# Patient Record
Sex: Male | Born: 1965 | Race: White | Hispanic: No | Marital: Married | Smoking: Former smoker
Health system: Southern US, Community
[De-identification: ages and names within clinical notes are randomized; demographics above are authoritative.]

## PROBLEM LIST (undated history)

## (undated) DIAGNOSIS — F419 Anxiety disorder, unspecified: Secondary | ICD-10-CM

## (undated) DIAGNOSIS — E7800 Pure hypercholesterolemia, unspecified: Secondary | ICD-10-CM

## (undated) DIAGNOSIS — I10 Essential (primary) hypertension: Secondary | ICD-10-CM

---

## 1988-07-14 HISTORY — PX: KNEE SURGERY: SHX244

## 2014-04-03 ENCOUNTER — Telehealth: Payer: Self-pay

## 2014-04-03 NOTE — Telephone Encounter (Signed)
Pt called to speak with DS regarding his letter to set up his colonoscopy. Patient said that he was extremely hard to reach by phone and he would try calling back today on his lunch break. I told him that I think DS goes to lunch from 1230-130 and he said that they would be on break during the same time. I asked if there was another time during the afternoon she could reach him and he said no that he would have to call DS whenever the opportunity presented itself. I told him that I would let DS be aware that he would be calling back and we will have to take it from there if she's available. Pt agreed.

## 2014-04-04 ENCOUNTER — Telehealth: Payer: Self-pay

## 2014-04-06 NOTE — Telephone Encounter (Signed)
Gastroenterology Pre-Procedure Review  Request Date:04/04/2014 Requesting Physician: Dr. Gerarda Fraction  PATIENT REVIEW QUESTIONS: The patient responded to the following health history questions as indicated:   This will be pt's first colonoscopy/ he said he drinks 2-3 beers daily   1. Diabetes Melitis: no 2. Joint replacements in the past 12 months: no 3. Major health problems in the past 3 months: no 4. Has an artificial valve or MVP: no 5. Has a defibrillator: no 6. Has been advised in past to take antibiotics in advance of a procedure like teeth cleaning: no    MEDICATIONS & ALLERGIES:    Patient reports the following regarding taking any blood thinners:   Plavix? no Aspirin? no Coumadin? no  Patient confirms/reports the following medications:  Current Outpatient Prescriptions  Medication Sig Dispense Refill  . clonazePAM (KLONOPIN) 2 MG tablet Take 2 mg by mouth daily.      . fenofibrate (TRICOR) 145 MG tablet Take 145 mg by mouth daily.      Marland Kitchen losartan (COZAAR) 50 MG tablet Take 50 mg by mouth daily.      . nabumetone (RELAFEN) 500 MG tablet Take 500 mg by mouth daily.      . simvastatin (ZOCOR) 40 MG tablet Take 40 mg by mouth daily.       No current facility-administered medications for this visit.    Patient confirms/reports the following allergies:  Allergies not on file  No orders of the defined types were placed in this encounter.    AUTHORIZATION INFORMATION Primary Insurance:   ID #:   Group #:  Pre-Cert / Auth required:  Pre-Cert / Auth #:   Secondary Insurance:   ID #:   Group #:  Pre-Cert / Auth required: Pre-Cert / Auth #:   SCHEDULE INFORMATION: Procedure has been scheduled as follows:  Date: 04/27/2014                 Time:  9:30 AM Location: [facility] Short Stay  This Gastroenterology Pre-Precedure Review Form is being routed to the following provider(s): Barney Drain, MD

## 2014-04-10 NOTE — Telephone Encounter (Signed)
MOVI PREP SPLIT DOSING, REGULAR BREAKFAST. CLEAR LIQUIDS AFTER 9 AM.  Pt will need phenergan AFTER HE SIGNS  HIS CONSENT.

## 2014-04-11 ENCOUNTER — Other Ambulatory Visit: Payer: Self-pay

## 2014-04-11 DIAGNOSIS — Z1211 Encounter for screening for malignant neoplasm of colon: Secondary | ICD-10-CM

## 2014-04-11 MED ORDER — PEG-KCL-NACL-NASULF-NA ASC-C 100 G PO SOLR: 1.0000 | ORAL | Status: DC

## 2014-04-11 NOTE — Telephone Encounter (Signed)
How much phenergan?

## 2014-04-11 NOTE — Telephone Encounter (Signed)
Rx sent to the pharmacy and instructions mailed to pt.  

## 2014-04-11 NOTE — Addendum Note (Signed)
Addended by: Everardo All on: 04/11/2014 10:43 AM   Modules accepted: Orders

## 2014-04-12 NOTE — Telephone Encounter (Signed)
Will give as part of sedation. Nurses just need to pull phenergan for the room.

## 2014-04-13 ENCOUNTER — Other Ambulatory Visit: Payer: Self-pay

## 2014-04-14 NOTE — Telephone Encounter (Signed)
Scott Buck is aware in Endo and has added the note.

## 2014-04-27 ENCOUNTER — Ambulatory Visit (HOSPITAL_COMMUNITY)
Admission: RE | Admit: 2014-04-27 | Discharge: 2014-04-27 | Disposition: A | Discharge status: Home / Self Care | Payer: BC Managed Care – PPO | Source: Ambulatory Visit | Attending: Gastroenterology | Admitting: Gastroenterology

## 2014-04-27 ENCOUNTER — Encounter (HOSPITAL_COMMUNITY): Payer: Self-pay | Admitting: *Deleted

## 2014-04-27 ENCOUNTER — Encounter (HOSPITAL_COMMUNITY)
Admission: RE | Disposition: A | Discharge status: Home / Self Care | Payer: Self-pay | Source: Ambulatory Visit | Attending: Gastroenterology

## 2014-04-27 DIAGNOSIS — D123 Benign neoplasm of transverse colon: Secondary | ICD-10-CM | POA: Diagnosis not present

## 2014-04-27 DIAGNOSIS — I10 Essential (primary) hypertension: Secondary | ICD-10-CM | POA: Diagnosis not present

## 2014-04-27 DIAGNOSIS — K648 Other hemorrhoids: Secondary | ICD-10-CM | POA: Insufficient documentation

## 2014-04-27 DIAGNOSIS — D128 Benign neoplasm of rectum: Secondary | ICD-10-CM

## 2014-04-27 DIAGNOSIS — F419 Anxiety disorder, unspecified: Secondary | ICD-10-CM | POA: Insufficient documentation

## 2014-04-27 DIAGNOSIS — Z1211 Encounter for screening for malignant neoplasm of colon: Secondary | ICD-10-CM | POA: Diagnosis present

## 2014-04-27 DIAGNOSIS — E780 Pure hypercholesterolemia: Secondary | ICD-10-CM | POA: Insufficient documentation

## 2014-04-27 DIAGNOSIS — Z79899 Other long term (current) drug therapy: Secondary | ICD-10-CM | POA: Diagnosis not present

## 2014-04-27 DIAGNOSIS — F17210 Nicotine dependence, cigarettes, uncomplicated: Secondary | ICD-10-CM | POA: Diagnosis not present

## 2014-04-27 HISTORY — DX: Pure hypercholesterolemia, unspecified: E78.00

## 2014-04-27 HISTORY — DX: Essential (primary) hypertension: I10

## 2014-04-27 HISTORY — PX: COLONOSCOPY: SHX5424

## 2014-04-27 HISTORY — DX: Anxiety disorder, unspecified: F41.9

## 2014-04-27 SURGERY — COLONOSCOPY
Anesthesia: Moderate Sedation

## 2014-04-27 MED ORDER — PROMETHAZINE HCL 25 MG/ML IJ SOLN
INTRAMUSCULAR | Status: AC
  Filled 2014-04-27: qty 1

## 2014-04-27 MED ORDER — SODIUM CHLORIDE 0.9 % IV SOLN
INTRAVENOUS | Status: DC
  Administered 2014-04-27: 1000 mL via INTRAVENOUS

## 2014-04-27 MED ORDER — STERILE WATER FOR IRRIGATION IR SOLN
Status: DC | PRN
  Administered 2014-04-27: 10:00:00

## 2014-04-27 MED ORDER — PROMETHAZINE HCL 25 MG/ML IJ SOLN
INTRAMUSCULAR | Status: DC | PRN
  Administered 2014-04-27: 12.5 mg via INTRAVENOUS

## 2014-04-27 MED ORDER — MIDAZOLAM HCL 5 MG/5ML IJ SOLN
INTRAMUSCULAR | Status: DC
Start: 2014-04-27 — End: 2014-04-27
  Filled 2014-04-27: qty 10

## 2014-04-27 MED ORDER — MEPERIDINE HCL 100 MG/ML IJ SOLN
INTRAMUSCULAR | Status: DC | PRN
  Administered 2014-04-27: 25 mg via INTRAVENOUS
  Administered 2014-04-27: 50 mg via INTRAVENOUS
  Administered 2014-04-27: 25 mg via INTRAVENOUS

## 2014-04-27 MED ORDER — MIDAZOLAM HCL 5 MG/5ML IJ SOLN
INTRAMUSCULAR | Status: DC | PRN
  Administered 2014-04-27: 2 mg via INTRAVENOUS
  Administered 2014-04-27: 1 mg via INTRAVENOUS
  Administered 2014-04-27: 2 mg via INTRAVENOUS
  Administered 2014-04-27: 1 mg via INTRAVENOUS

## 2014-04-27 MED ORDER — MEPERIDINE HCL 100 MG/ML IJ SOLN
INTRAMUSCULAR | Status: AC
  Filled 2014-04-27: qty 2

## 2014-04-27 NOTE — Discharge Instructions (Signed)
You had 2 polyps removed. You have internal hemorrhoids.    FOLLOW A HIGH FIBER DIET. AVOID ITEMS THAT CAUSE BLOATING & GAS. SEE INFO BELOW.  YOUR BIOPSY RESULTS SHOULD BE BACK IN 14 DAYS.  Next colonoscopy in 5-10 years. Colonoscopy Care After Read the instructions outlined below and refer to this sheet in the next week. These discharge instructions provide you with general information on caring for yourself after you leave the hospital. While your treatment has been planned according to the most current medical practices available, unavoidable complications occasionally occur. If you have any problems or questions after discharge, call DR. Pasqual Farias, [phone].  ACTIVITY  You may resume your regular activity, but move at a slower pace for the next 24 hours.   Take frequent rest periods for the next 24 hours.   Walking will help get rid of the air and reduce the bloated feeling in your belly (abdomen).   No driving for 24 hours (because of the medicine (anesthesia) used during the test).   You may shower.   Do not sign any important legal documents or operate any machinery for 24 hours (because of the anesthesia used during the test).    NUTRITION  Drink plenty of fluids.   You may resume your normal diet as instructed by your doctor.   Begin with a light meal and progress to your normal diet. Heavy or fried foods are harder to digest and may make you feel sick to your stomach (nauseated).   Avoid alcoholic beverages for 24 hours or as instructed.    MEDICATIONS  You may resume your normal medications.   WHAT YOU CAN EXPECT TODAY  Some feelings of bloating in the abdomen.   Passage of more gas than usual.   Spotting of blood in your stool or on the toilet paper  .  IF YOU HAD POLYPS REMOVED DURING THE COLONOSCOPY:  Eat a soft diet IF YOU HAVE NAUSEA, BLOATING, ABDOMINAL PAIN, OR VOMITING.    FINDING OUT THE RESULTS OF YOUR TEST Not all test results are  available during your visit. DR. Oneida Alar WILL CALL YOU WITHIN 7 DAYS OF YOUR PROCEDUE WITH YOUR RESULTS. Do not assume everything is normal if you have not heard from DR. Silviano Neuser IN ONE WEEK, CALL HER OFFICE AT [phone].  SEEK IMMEDIATE MEDICAL ATTENTION AND CALL THE OFFICE: [phone] IF:  You have more than a spotting of blood in your stool.   Your belly is swollen (abdominal distention).   You are nauseated or vomiting.   You have a temperature over 101F.   You have abdominal pain or discomfort that is severe or gets worse throughout the day.   High-Fiber Diet A high-fiber diet changes your normal diet to include more whole grains, legumes, fruits, and vegetables. Changes in the diet involve replacing refined carbohydrates with unrefined foods. The calorie level of the diet is essentially unchanged. The Dietary Reference Intake (recommended amount) for adult males is 38 grams per day. For adult females, it is 25 grams per day. Pregnant and lactating women should consume 28 grams of fiber per day. Fiber is the intact part of a plant that is not broken down during digestion. Functional fiber is fiber that has been isolated from the plant to provide a beneficial effect in the body. PURPOSE  Increase stool bulk.   Ease and regulate bowel movements.   Lower cholesterol.  INDICATIONS THAT YOU NEED MORE FIBER  Constipation and hemorrhoids.   Uncomplicated diverticulosis (intestine condition)  and irritable bowel syndrome.   Weight management.   As a protective measure against hardening of the arteries (atherosclerosis), diabetes, and cancer.   GUIDELINES FOR INCREASING FIBER IN THE DIET  Start adding fiber to the diet slowly. A gradual increase of about 5 more grams (2 slices of whole-wheat bread, 2 servings of most fruits or vegetables, or 1 bowl of high-fiber cereal) per day is best. Too rapid an increase in fiber may result in constipation, flatulence, and bloating.   Drink  enough water and fluids to keep your urine clear or pale yellow. Water, juice, or caffeine-free drinks are recommended. Not drinking enough fluid may cause constipation.   Eat a variety of high-fiber foods rather than one type of fiber.   Try to increase your intake of fiber through using high-fiber foods rather than fiber pills or supplements that contain small amounts of fiber.   The goal is to change the types of food eaten. Do not supplement your present diet with high-fiber foods, but replace foods in your present diet.  INCLUDE A VARIETY OF FIBER SOURCES  Replace refined and processed grains with whole grains, canned fruits with fresh fruits, and incorporate other fiber sources. White rice, white breads, and most bakery goods contain little or no fiber.   Brown whole-grain rice, buckwheat oats, and many fruits and vegetables are all good sources of fiber. These include: broccoli, Brussels sprouts, cabbage, cauliflower, beets, sweet potatoes, white potatoes (skin on), carrots, tomatoes, eggplant, squash, berries, fresh fruits, and dried fruits.   Cereals appear to be the richest source of fiber. Cereal fiber is found in whole grains and bran. Bran is the fiber-rich outer coat of cereal grain, which is largely removed in refining. In whole-grain cereals, the bran remains. In breakfast cereals, the largest amount of fiber is found in those with "bran" in their names. The fiber content is sometimes indicated on the label.   You may need to include additional fruits and vegetables each day.   In baking, for 1 cup white flour, you may use the following substitutions:   1 cup whole-wheat flour minus 2 tablespoons.   1/2 cup white flour plus 1/2 cup whole-wheat flour.   Polyps, Colon  A polyp is extra tissue that grows inside your body. Colon polyps grow in the large intestine. The large intestine, also called the colon, is part of your digestive system. It is a long, hollow tube at the end of  your digestive tract where your body makes and stores stool. Most polyps are not dangerous. They are benign. This means they are not cancerous. But over time, some types of polyps can turn into cancer. Polyps that are smaller than a pea are usually not harmful. But larger polyps could someday become or may already be cancerous. To be safe, doctors remove all polyps and test them.   WHO GETS POLYPS? Anyone can get polyps, but certain people are more likely than others. You may have a greater chance of getting polyps if:  You are over 50.   You have had polyps before.   Someone in your family has had polyps.   Someone in your family has had cancer of the large intestine.   Find out if someone in your family has had polyps. You may also be more likely to get polyps if you:   Eat a lot of fatty foods   Smoke   Drink alcohol   Do not exercise  Eat too much   TREATMENT  The caregiver will remove the polyp during sigmoidoscopy or colonoscopy.    PREVENTION There is not one sure way to prevent polyps. You might be able to lower your risk of getting them if you:  Eat more fruits and vegetables and less fatty food.   Do not smoke.   Avoid alcohol.   Exercise every day.   Lose weight if you are overweight.   Eating more calcium and folate can also lower your risk of getting polyps. Some foods that are rich in calcium are milk, cheese, and broccoli. Some foods that are rich in folate are chickpeas, kidney beans, and spinach.   Hemorrhoids Hemorrhoids are dilated (enlarged) veins around the rectum. Sometimes clots will form in the veins. This makes them swollen and painful. These are called thrombosed hemorrhoids. Causes of hemorrhoids include:  Constipation.   Straining to have a bowel movement.   HEAVY LIFTING HOME CARE INSTRUCTIONS  Eat a well balanced diet and drink 6 to 8 glasses of water every day to avoid constipation. You may also use a bulk laxative.   Avoid  straining to have bowel movements.   Keep anal area dry and clean.   Do not use a donut shaped pillow or sit on the toilet for long periods. This increases blood pooling and pain.   Move your bowels when your body has the urge; this will require less straining and will decrease pain and pressure.

## 2014-04-27 NOTE — H&P (Signed)
  Primary Care Physician:  Glo Herring., MD Primary Gastroenterologist:  Dr. Oneida Alar  Pre-Procedure History & Physical: HPI:  Scott Buck is a 48 y.o. male here for COLON CANCER SCREENING.  Past Medical History  Diagnosis Date  . Hypertension   . Hypercholesteremia   . Anxiety     Past Surgical History  Procedure Laterality Date  . Knee surgery Right 1990    To repair right patella    Prior to Admission medications   Medication Sig Start Date End Date Taking? Authorizing Provider  acetaminophen (TYLENOL) 500 MG tablet Take 1,000 mg by mouth daily.   Yes Historical Provider, MD  clonazePAM (KLONOPIN) 2 MG tablet Take 2 mg by mouth daily.   Yes Historical Provider, MD  fenofibrate (TRICOR) 145 MG tablet Take 145 mg by mouth daily.   Yes Historical Provider, MD  losartan (COZAAR) 50 MG tablet Take 50 mg by mouth daily.   Yes Historical Provider, MD  nabumetone (RELAFEN) 500 MG tablet Take 500 mg by mouth daily.   Yes Historical Provider, MD  peg 3350 powder (MOVIPREP) 100 G SOLR Take 1 kit (200 g total) by mouth as directed. 04/11/14  Yes Danie Binder, MD  simvastatin (ZOCOR) 40 MG tablet Take 40 mg by mouth at bedtime.    Yes Historical Provider, MD    Allergies as of 04/13/2014  . (Not on File)    Family History  Problem Relation Age of Onset  . Cancer - Other Mother   . Cancer - Lung Father   . Emphysema Father   . Pulmonary fibrosis Father   . Hypertension Sister   . Hypercholesterolemia Sister   . Hypertension Brother   . Hypercholesterolemia Brother   . Hypertension Brother   . Hypercholesterolemia Brother     History   Social History  . Marital Status: Married    Spouse Name: N/A    Number of Children: N/A  . Years of Education: N/A   Occupational History  . Not on file.   Social History Main Topics  . Smoking status: Current Every Day Smoker -- 0.75 packs/day for 35 years    Types: Cigarettes  . Smokeless tobacco: Not on file  . Alcohol Use:  Yes     Comment: 12 pack Beer per week  . Drug Use: No  . Sexual Activity: Not on file   Other Topics Concern  . Not on file   Social History Narrative  . No narrative on file    Review of Systems: See HPI, otherwise negative ROS   Physical Exam: BP 134/84  Pulse 83  Temp(Src) 97.5 F (36.4 C) (Oral)  Resp 17  Ht $R'5\' 7"'Be$  (1.702 m)  Wt 172 lb (78.019 kg)  BMI 26.93 kg/m2  SpO2 99% General:   Alert,  pleasant and cooperative in NAD Head:  Normocephalic and atraumatic. Neck:  Supple; Lungs:  Clear throughout to auscultation.    Heart:  Regular rate and rhythm. Abdomen:  Soft, nontender and nondistended. Normal bowel sounds, without guarding, and without rebound.   Neurologic:  Alert and  oriented x4;  grossly normal neurologically.  Impression/Plan:     SCREENING  Plan:  1. TCS TODAY

## 2014-04-30 NOTE — Op Note (Signed)
[facility] [address] [place], [address]   COLONOSCOPY PROCEDURE REPORT  PATIENT: Scott Buck, Scott Buck  MR#: [id number] BIRTHDATE: 1965-12-30 , 48  yrs. old GENDER: male ENDOSCOPIST: Barney Drain, MD REFERRED CZ:YSAYT Gerarda Fraction, M.D. PROCEDURE DATE:  04/27/2014 PROCEDURE:   Colonoscopy with snare polypectomy and Colonoscopy with cold biopsy polypectomy INDICATIONS:average risk for colon cancer. MEDICATIONS: Promethazine (Phenergan) 12.5 mg IV, Demerol 100 mg IV, and Versed 6 mg IV  DESCRIPTION OF PROCEDURE:    Physical exam was performed.  Informed consent was obtained from the patient after explaining the benefits, risks, and alternatives to procedure.  The patient was connected to monitor and placed in left lateral position. Continuous oxygen was provided by nasal cannula and IV medicine administered through an indwelling cannula.  After administration of sedation and rectal exam, the patients rectum was intubated and the EC-3890Li ([id number])  colonoscope was advanced under direct visualization to the ileum.  The scope was removed slowly by carefully examining the color, texture, anatomy, and integrity mucosa on the way out.  The patient was recovered in endoscopy and discharged home in satisfactory condition.     COLON FINDINGS: A sessile polyp measuring 6 mm in size was found at the hepatic flexure.  A polypectomy was performed using snare cautery.  , A sessile polyp measuring 4 mm in size was found in the rectum.  , The examination was otherwise normal.  , and Moderate sized internal hemorrhoids were found.  PREP QUALITY: excellent.  CECAL W/D TIME: 12 MINS COMPLICATIONS: None  ENDOSCOPIC IMPRESSION: 1.   TWO COLON POLYPS REMOVED 2.   Moderate sized internal hemorrhoids  RECOMMENDATIONS: FOLLOW A HIGH FIBER DIET.  AVOID ITEMS THAT CAUSE BLOATING & GAS. BIOPSY RESULTS SHOULD BE BACK IN 14 DAYS. Next colonoscopy in 5-10  years.      _______________________________ Lorrin MaisBarney Drain, MD May 14, 2014 10:59 AM   CPT CODES: ICD CODES:  The ICD and CPT codes recommended by this software are interpretations from the data that the clinical staff has captured with the software.  The verification of the translation of this report to the ICD and CPT codes and modifiers is the sole responsibility of the health care institution and practicing physician where this report was generated.  [employer] will not be held responsible for the validity of the ICD and CPT codes included on this report.  AMA assumes no liability for data contained or not contained herein. CPT is a Designer, television/film set of the [employer].

## 2014-05-01 ENCOUNTER — Encounter (HOSPITAL_COMMUNITY): Payer: Self-pay | Admitting: Gastroenterology

## 2014-05-04 ENCOUNTER — Telehealth: Payer: Self-pay | Admitting: Gastroenterology

## 2014-05-04 NOTE — Telephone Encounter (Signed)
PATIENT CALLED INQUIRING ABOUT RESULTS FROM COLONOSCOPY

## 2014-05-09 NOTE — Telephone Encounter (Signed)
Reminder in epic °

## 2014-05-09 NOTE — Telephone Encounter (Signed)
Please call pt. HE had ONE simple ADENOMA AND ONE HYPERPLASTIC POLYP removed. FOLLOW A HIGH FIBER DIET. TCS IN 5 YEARS.

## 2014-05-09 NOTE — Telephone Encounter (Signed)
Pt called again and has not heard from the path on colonoscopy. He said he knew Dr. Oneida Alar removed two polyps. I explained to him that she is on vacation and he is very concerned, because he had a couple of calls from [facility], but no one left a message. I told him I will get Laban Emperor, NP to look at the results so I can give him a call back later today and put his mind at ease. He can be reached at work this afternoon at [phone].

## 2014-05-09 NOTE — Telephone Encounter (Signed)
LMOM to call back

## 2014-05-10 NOTE — Telephone Encounter (Signed)
Pt is aware of results. 

## 2015-09-25 ENCOUNTER — Other Ambulatory Visit (HOSPITAL_COMMUNITY): Payer: Self-pay | Admitting: Family Medicine

## 2015-09-25 ENCOUNTER — Ambulatory Visit (HOSPITAL_COMMUNITY)
Admission: RE | Admit: 2015-09-25 | Discharge: 2015-09-25 | Disposition: A | Discharge status: Home / Self Care | Payer: BLUE CROSS/BLUE SHIELD | Source: Ambulatory Visit | Attending: Family Medicine | Admitting: Family Medicine

## 2015-09-25 DIAGNOSIS — J069 Acute upper respiratory infection, unspecified: Secondary | ICD-10-CM | POA: Insufficient documentation

## 2015-09-25 DIAGNOSIS — Z8701 Personal history of pneumonia (recurrent): Secondary | ICD-10-CM | POA: Insufficient documentation

## 2015-09-25 DIAGNOSIS — J181 Lobar pneumonia, unspecified organism: Secondary | ICD-10-CM | POA: Diagnosis present

## 2015-09-25 DIAGNOSIS — J111 Influenza due to unidentified influenza virus with other respiratory manifestations: Secondary | ICD-10-CM | POA: Diagnosis present

## 2015-11-15 DIAGNOSIS — D225 Melanocytic nevi of trunk: Secondary | ICD-10-CM | POA: Diagnosis not present

## 2015-11-15 DIAGNOSIS — F419 Anxiety disorder, unspecified: Secondary | ICD-10-CM | POA: Diagnosis not present

## 2015-11-15 DIAGNOSIS — Z1389 Encounter for screening for other disorder: Secondary | ICD-10-CM | POA: Diagnosis not present

## 2015-11-15 DIAGNOSIS — Z6825 Body mass index (BMI) 25.0-25.9, adult: Secondary | ICD-10-CM | POA: Diagnosis not present

## 2015-11-15 DIAGNOSIS — Z1283 Encounter for screening for malignant neoplasm of skin: Secondary | ICD-10-CM | POA: Diagnosis not present

## 2016-02-15 DIAGNOSIS — Z1389 Encounter for screening for other disorder: Secondary | ICD-10-CM | POA: Diagnosis not present

## 2016-02-15 DIAGNOSIS — I10 Essential (primary) hypertension: Secondary | ICD-10-CM | POA: Diagnosis not present

## 2016-02-15 DIAGNOSIS — Z6825 Body mass index (BMI) 25.0-25.9, adult: Secondary | ICD-10-CM | POA: Diagnosis not present

## 2016-02-15 DIAGNOSIS — Z0001 Encounter for general adult medical examination with abnormal findings: Secondary | ICD-10-CM | POA: Diagnosis not present

## 2016-02-15 DIAGNOSIS — E782 Mixed hyperlipidemia: Secondary | ICD-10-CM | POA: Diagnosis not present

## 2016-02-15 DIAGNOSIS — N529 Male erectile dysfunction, unspecified: Secondary | ICD-10-CM | POA: Diagnosis not present

## 2016-02-15 DIAGNOSIS — E663 Overweight: Secondary | ICD-10-CM | POA: Diagnosis not present

## 2016-02-15 DIAGNOSIS — M1731 Unilateral post-traumatic osteoarthritis, right knee: Secondary | ICD-10-CM | POA: Diagnosis not present

## 2016-02-15 DIAGNOSIS — M25561 Pain in right knee: Secondary | ICD-10-CM | POA: Diagnosis not present

## 2016-03-14 DIAGNOSIS — M25561 Pain in right knee: Secondary | ICD-10-CM | POA: Diagnosis not present

## 2016-03-20 DIAGNOSIS — M25561 Pain in right knee: Secondary | ICD-10-CM | POA: Diagnosis not present

## 2016-03-20 DIAGNOSIS — M1731 Unilateral post-traumatic osteoarthritis, right knee: Secondary | ICD-10-CM | POA: Diagnosis not present

## 2016-08-11 DIAGNOSIS — J029 Acute pharyngitis, unspecified: Secondary | ICD-10-CM | POA: Diagnosis not present

## 2016-08-11 DIAGNOSIS — Z1389 Encounter for screening for other disorder: Secondary | ICD-10-CM | POA: Diagnosis not present

## 2016-08-11 DIAGNOSIS — I10 Essential (primary) hypertension: Secondary | ICD-10-CM | POA: Diagnosis not present

## 2016-08-11 DIAGNOSIS — R05 Cough: Secondary | ICD-10-CM | POA: Diagnosis not present

## 2016-08-11 DIAGNOSIS — Z6825 Body mass index (BMI) 25.0-25.9, adult: Secondary | ICD-10-CM | POA: Diagnosis not present

## 2016-08-11 DIAGNOSIS — Z79899 Other long term (current) drug therapy: Secondary | ICD-10-CM | POA: Diagnosis not present

## 2016-10-03 DIAGNOSIS — Z6825 Body mass index (BMI) 25.0-25.9, adult: Secondary | ICD-10-CM | POA: Diagnosis not present

## 2016-10-03 DIAGNOSIS — Z1389 Encounter for screening for other disorder: Secondary | ICD-10-CM | POA: Diagnosis not present

## 2016-10-03 DIAGNOSIS — M62838 Other muscle spasm: Secondary | ICD-10-CM | POA: Diagnosis not present

## 2016-11-07 DIAGNOSIS — M542 Cervicalgia: Secondary | ICD-10-CM | POA: Diagnosis not present

## 2016-11-19 DIAGNOSIS — M542 Cervicalgia: Secondary | ICD-10-CM | POA: Diagnosis not present

## 2016-11-25 DIAGNOSIS — M4682 Other specified inflammatory spondylopathies, cervical region: Secondary | ICD-10-CM | POA: Diagnosis not present

## 2016-11-25 DIAGNOSIS — M50221 Other cervical disc displacement at C4-C5 level: Secondary | ICD-10-CM | POA: Diagnosis not present

## 2016-11-25 DIAGNOSIS — M4802 Spinal stenosis, cervical region: Secondary | ICD-10-CM | POA: Diagnosis not present

## 2016-11-25 DIAGNOSIS — M542 Cervicalgia: Secondary | ICD-10-CM | POA: Diagnosis not present

## 2016-12-16 DIAGNOSIS — M50221 Other cervical disc displacement at C4-C5 level: Secondary | ICD-10-CM | POA: Diagnosis not present

## 2016-12-16 DIAGNOSIS — M542 Cervicalgia: Secondary | ICD-10-CM | POA: Diagnosis not present

## 2016-12-23 DIAGNOSIS — Z1389 Encounter for screening for other disorder: Secondary | ICD-10-CM | POA: Diagnosis not present

## 2016-12-23 DIAGNOSIS — E663 Overweight: Secondary | ICD-10-CM | POA: Diagnosis not present

## 2016-12-23 DIAGNOSIS — Z6825 Body mass index (BMI) 25.0-25.9, adult: Secondary | ICD-10-CM | POA: Diagnosis not present

## 2016-12-23 DIAGNOSIS — I10 Essential (primary) hypertension: Secondary | ICD-10-CM | POA: Diagnosis not present

## 2016-12-23 DIAGNOSIS — E782 Mixed hyperlipidemia: Secondary | ICD-10-CM | POA: Diagnosis not present

## 2016-12-23 DIAGNOSIS — E669 Obesity, unspecified: Secondary | ICD-10-CM | POA: Diagnosis not present

## 2016-12-23 DIAGNOSIS — F419 Anxiety disorder, unspecified: Secondary | ICD-10-CM | POA: Diagnosis not present

## 2017-03-02 DIAGNOSIS — Z6825 Body mass index (BMI) 25.0-25.9, adult: Secondary | ICD-10-CM | POA: Diagnosis not present

## 2017-03-02 DIAGNOSIS — F419 Anxiety disorder, unspecified: Secondary | ICD-10-CM | POA: Diagnosis not present

## 2017-03-02 DIAGNOSIS — E782 Mixed hyperlipidemia: Secondary | ICD-10-CM | POA: Diagnosis not present

## 2017-03-02 DIAGNOSIS — E291 Testicular hypofunction: Secondary | ICD-10-CM | POA: Diagnosis not present

## 2017-03-02 DIAGNOSIS — E663 Overweight: Secondary | ICD-10-CM | POA: Diagnosis not present

## 2017-03-02 DIAGNOSIS — K7689 Other specified diseases of liver: Secondary | ICD-10-CM | POA: Diagnosis not present

## 2017-03-02 DIAGNOSIS — R945 Abnormal results of liver function studies: Secondary | ICD-10-CM | POA: Diagnosis not present

## 2017-03-02 DIAGNOSIS — Z1389 Encounter for screening for other disorder: Secondary | ICD-10-CM | POA: Diagnosis not present

## 2017-04-06 ENCOUNTER — Encounter: Payer: Self-pay | Admitting: Gastroenterology

## 2017-05-13 ENCOUNTER — Ambulatory Visit: Payer: BLUE CROSS/BLUE SHIELD | Admitting: Gastroenterology

## 2017-07-03 DIAGNOSIS — R945 Abnormal results of liver function studies: Secondary | ICD-10-CM | POA: Diagnosis not present

## 2017-10-08 DIAGNOSIS — F419 Anxiety disorder, unspecified: Secondary | ICD-10-CM | POA: Diagnosis not present

## 2017-10-08 DIAGNOSIS — H8113 Benign paroxysmal vertigo, bilateral: Secondary | ICD-10-CM | POA: Diagnosis not present

## 2017-10-08 DIAGNOSIS — J329 Chronic sinusitis, unspecified: Secondary | ICD-10-CM | POA: Diagnosis not present

## 2017-10-08 DIAGNOSIS — R42 Dizziness and giddiness: Secondary | ICD-10-CM | POA: Diagnosis not present

## 2017-10-08 DIAGNOSIS — Z6825 Body mass index (BMI) 25.0-25.9, adult: Secondary | ICD-10-CM | POA: Diagnosis not present

## 2017-10-19 DIAGNOSIS — H52223 Regular astigmatism, bilateral: Secondary | ICD-10-CM | POA: Diagnosis not present

## 2017-10-19 DIAGNOSIS — H5203 Hypermetropia, bilateral: Secondary | ICD-10-CM | POA: Diagnosis not present

## 2017-10-19 DIAGNOSIS — H524 Presbyopia: Secondary | ICD-10-CM | POA: Diagnosis not present

## 2017-10-19 DIAGNOSIS — M5030 Other cervical disc degeneration, unspecified cervical region: Secondary | ICD-10-CM | POA: Diagnosis not present

## 2017-11-02 DIAGNOSIS — F17200 Nicotine dependence, unspecified, uncomplicated: Secondary | ICD-10-CM | POA: Diagnosis not present

## 2017-11-02 DIAGNOSIS — R42 Dizziness and giddiness: Secondary | ICD-10-CM | POA: Diagnosis not present

## 2017-11-02 DIAGNOSIS — Z7289 Other problems related to lifestyle: Secondary | ICD-10-CM | POA: Diagnosis not present

## 2017-11-02 DIAGNOSIS — Z8739 Personal history of other diseases of the musculoskeletal system and connective tissue: Secondary | ICD-10-CM | POA: Diagnosis not present

## 2018-01-25 IMAGING — DX DG CHEST 2V
2 series · 2 of 2 positions shown · non-contrast
Comparison: None.

CLINICAL DATA: Five months of chest pain when taking a deep breath.

EXAM:
CHEST  2 VIEW

[chest pa]
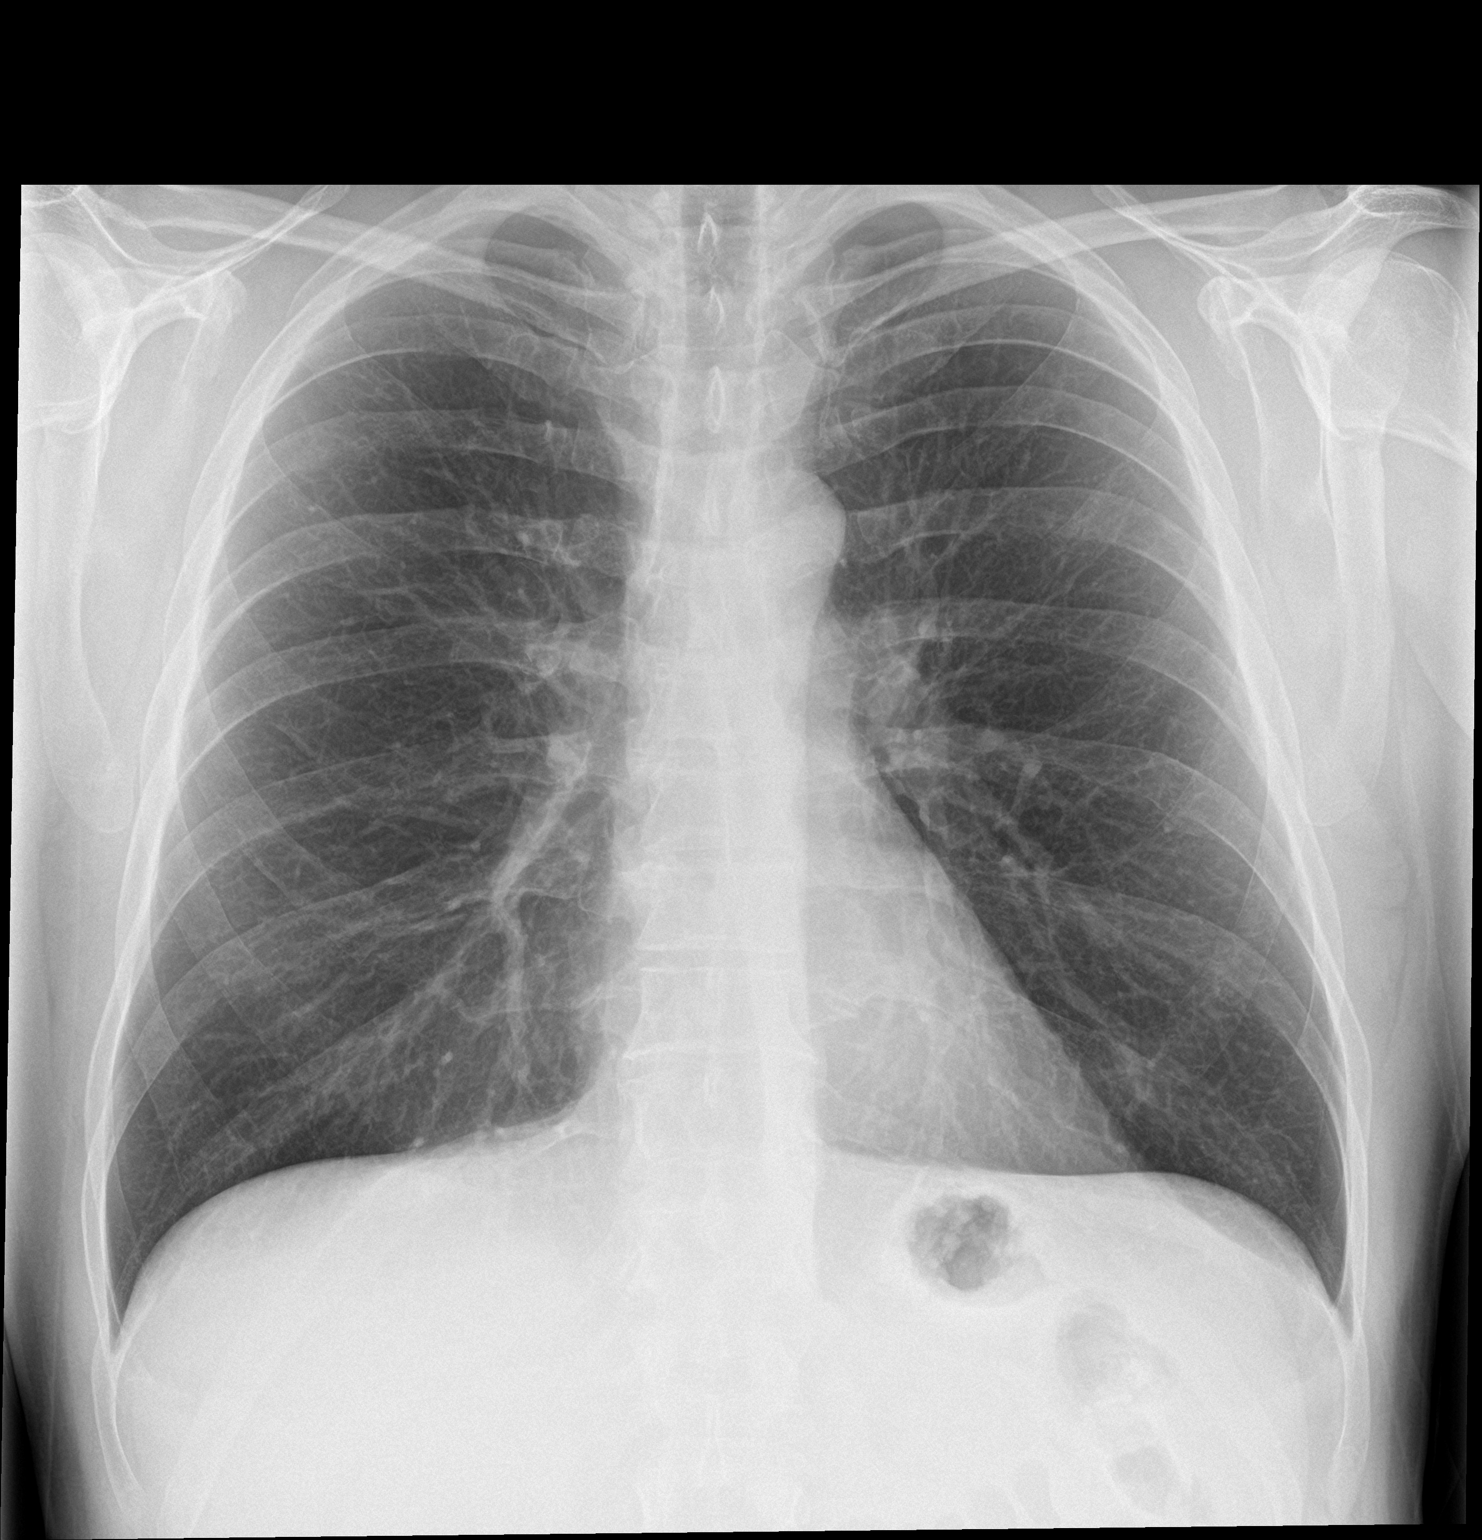

[chest lat]
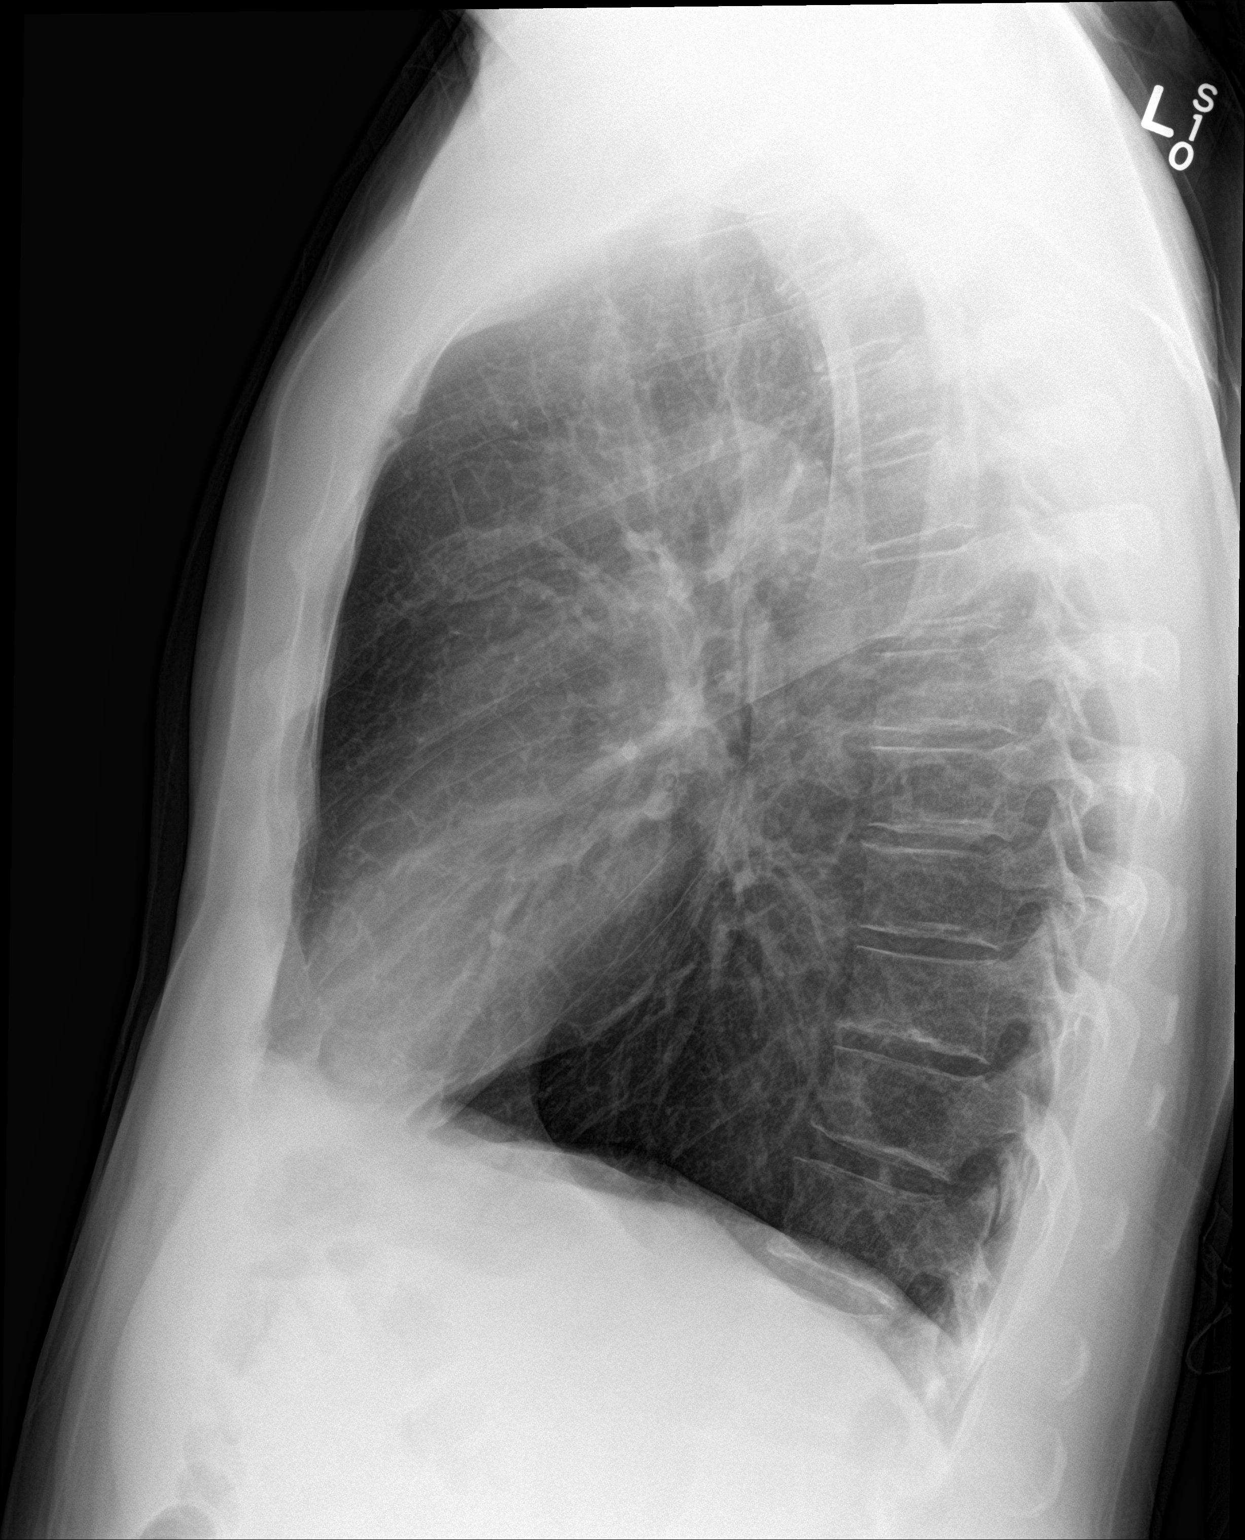

[2 of 2 positions shown; findings below may reference images not displayed]

FINDINGS: There is hyperinflation of the lungs compatible with COPD. Heart and
mediastinal contours are within normal limits. No focal opacities or
effusions. No acute bony abnormality. COPD. No active disease.
IMPRESSION: No active cardiopulmonary disease.

## 2018-07-21 DIAGNOSIS — Z6824 Body mass index (BMI) 24.0-24.9, adult: Secondary | ICD-10-CM | POA: Diagnosis not present

## 2018-07-21 DIAGNOSIS — R1010 Upper abdominal pain, unspecified: Secondary | ICD-10-CM | POA: Diagnosis not present

## 2018-07-21 DIAGNOSIS — R198 Other specified symptoms and signs involving the digestive system and abdomen: Secondary | ICD-10-CM | POA: Diagnosis not present

## 2018-07-21 DIAGNOSIS — Z1389 Encounter for screening for other disorder: Secondary | ICD-10-CM | POA: Diagnosis not present

## 2019-01-19 DIAGNOSIS — Z6825 Body mass index (BMI) 25.0-25.9, adult: Secondary | ICD-10-CM | POA: Diagnosis not present

## 2019-01-19 DIAGNOSIS — F5101 Primary insomnia: Secondary | ICD-10-CM | POA: Diagnosis not present

## 2019-01-19 DIAGNOSIS — E663 Overweight: Secondary | ICD-10-CM | POA: Diagnosis not present

## 2019-01-19 DIAGNOSIS — Z1389 Encounter for screening for other disorder: Secondary | ICD-10-CM | POA: Diagnosis not present

## 2019-03-14 DIAGNOSIS — H5203 Hypermetropia, bilateral: Secondary | ICD-10-CM | POA: Diagnosis not present

## 2019-04-12 ENCOUNTER — Encounter: Payer: Self-pay | Admitting: Gastroenterology

## 2019-04-27 DIAGNOSIS — L820 Inflamed seborrheic keratosis: Secondary | ICD-10-CM | POA: Diagnosis not present

## 2019-04-27 DIAGNOSIS — D225 Melanocytic nevi of trunk: Secondary | ICD-10-CM | POA: Diagnosis not present

## 2019-04-27 DIAGNOSIS — D485 Neoplasm of uncertain behavior of skin: Secondary | ICD-10-CM | POA: Diagnosis not present

## 2019-04-30 DIAGNOSIS — Z20828 Contact with and (suspected) exposure to other viral communicable diseases: Secondary | ICD-10-CM | POA: Diagnosis not present

## 2019-06-07 NOTE — Progress Notes (Signed)
Primary Care Physician:  Redmond School, MD  Primary Gastroenterologist:  Barney Drain, MD   Chief Complaint  Patient presents with  . Colonoscopy    5 yr recall    HPI:  Scott Buck is a 53 y.o. male here to schedule surveillance colonoscopy.  Last colonoscopy in October 2015, 2 polyps removed, one was a simple adenoma and the other hyperplastic polyp.  Advised to have a 5-year follow-up colonoscopy.    Patient presents today to schedule his surveillance colonoscopy.  He states he remembers waking up during his last 1 and would like to avoid this.  From a GI standpoint he is doing well.  No constipation or diarrhea.  No melena or rectal bleeding.  No abdominal pain.  No upper GI symptoms.  He tells me his father and sister both had colon polyps.  No family history of colon cancer.   Current Outpatient Medications  Medication Sig Dispense Refill  . acetaminophen (TYLENOL) 500 MG tablet Take 1,000 mg by mouth as needed.     . clonazePAM (KLONOPIN) 2 MG tablet Take 2 mg by mouth daily.    Marland Kitchen ezetimibe-simvastatin (VYTORIN) 10-40 MG tablet Take 1 tablet by mouth daily.    Marland Kitchen losartan (COZAAR) 50 MG tablet Take 50 mg by mouth daily.     No current facility-administered medications for this visit.     Allergies as of 06/08/2019  . (No Known Allergies)    Past Medical History:  Diagnosis Date  . Anxiety   . Hypercholesteremia   . Hypertension     Past Surgical History:  Procedure Laterality Date  . COLONOSCOPY N/A 04/27/2014   Dr. Oneida Alar: 2 polyps removed, one was a simple adenoma.  Next colonoscopy 5 years.  Marland Kitchen KNEE SURGERY Right 1990   To repair right patella    Family History  Problem Relation Age of Onset  . Cancer - Other Mother   . Cancer - Lung Father   . Emphysema Father   . Pulmonary fibrosis Father   . Colon polyps Father   . Hypertension Sister   . Hypercholesterolemia Sister   . Colon polyps Sister   . Hypertension Brother   . Hypercholesterolemia  Brother   . Hypertension Brother   . Hypercholesterolemia Brother   . Colon cancer Neg Hx     Social History   Socioeconomic History  . Marital status: Married    Spouse name: Not on file  . Number of children: Not on file  . Years of education: Not on file  . Highest education level: Not on file  Occupational History  . Not on file  Social Needs  . Financial resource strain: Not on file  . Food insecurity    Worry: Not on file    Inability: Not on file  . Transportation needs    Medical: Not on file    Non-medical: Not on file  Tobacco Use  . Smoking status: Current Every Day Smoker    Packs/day: 0.75    Years: 35.00    Pack years: 26.25    Types: Cigarettes  . Smokeless tobacco: Never Used  Substance and Sexual Activity  . Alcohol use: Yes    Comment: 2-3 beer daily  . Drug use: No  . Sexual activity: Not on file  Lifestyle  . Physical activity    Days per week: Not on file    Minutes per session: Not on file  . Stress: Not on file  Relationships  . Social  connections    Talks on phone: Not on file    Gets together: Not on file    Attends religious service: Not on file    Active member of club or organization: Not on file    Attends meetings of clubs or organizations: Not on file    Relationship status: Not on file  . Intimate partner violence    Fear of current or ex partner: Not on file    Emotionally abused: Not on file    Physically abused: Not on file    Forced sexual activity: Not on file  Other Topics Concern  . Not on file  Social History Narrative  . Not on file      ROS:  General: Negative for anorexia, weight loss, fever, chills, fatigue, weakness. Eyes: Negative for vision changes.  ENT: Negative for hoarseness, difficulty swallowing , nasal congestion. CV: Negative for chest pain, angina, palpitations, dyspnea on exertion, peripheral edema.  Respiratory: Negative for dyspnea at rest, dyspnea on exertion, cough, sputum, wheezing.  GI:  See history of present illness. GU:  Negative for dysuria, hematuria, urinary incontinence, urinary frequency, nocturnal urination.  MS: Negative for joint pain, low back pain.  Derm: Negative for rash or itching.  Neuro: Negative for weakness, abnormal sensation, seizure, frequent headaches, memory loss, confusion.  Psych: Negative for anxiety, depression, suicidal ideation, hallucinations.  Endo: Negative for unusual weight change.  Heme: Negative for bruising or bleeding. Allergy: Negative for rash or hives.    Physical Examination:  BP (!) 158/89   Pulse 99   Temp (!) 96.8 F (36 C) (Temporal)   Ht 5\' 7"  (1.702 m)   Wt 167 lb (75.8 kg)   BMI 26.16 kg/m    General: Well-nourished, well-developed in no acute distress.  Head: Normocephalic, atraumatic.   Eyes: Conjunctiva pink, no icterus. Mouth: Wearing mask Neck: Supple without thyromegaly, masses, or lymphadenopathy.  Lungs: Clear to auscultation bilaterally.  Heart: Regular rate and rhythm, no murmurs rubs or gallops.  Abdomen: Bowel sounds are normal, nontender, nondistended, no hepatosplenomegaly or masses, no abdominal bruits or    hernia , no rebound or guarding.   Rectal: Deferred Extremities: No lower extremity edema. No clubbing or deformities.  Neuro: Alert and oriented x 4 , grossly normal neurologically.  Skin: Warm and dry, no rash or jaundice.   Psych: Alert and cooperative, normal mood and affect.    Imaging Studies: No results found.

## 2019-06-08 ENCOUNTER — Encounter: Payer: Self-pay | Admitting: Gastroenterology

## 2019-06-08 ENCOUNTER — Ambulatory Visit (INDEPENDENT_AMBULATORY_CARE_PROVIDER_SITE_OTHER): Payer: BC Managed Care – PPO | Admitting: Gastroenterology

## 2019-06-08 ENCOUNTER — Other Ambulatory Visit: Payer: Self-pay

## 2019-06-08 DIAGNOSIS — Z86010 Personal history of colonic polyps: Secondary | ICD-10-CM | POA: Diagnosis not present

## 2019-06-08 NOTE — Patient Instructions (Signed)
1. Colonoscopy as scheduled. Please see separate instructions. 

## 2019-06-08 NOTE — Assessment & Plan Note (Signed)
53 year old gentleman with history of adenomatous colon polyp back in 2015 presenting for surveillance colonoscopy.  Reports that his father and sister also had colon polyps.  Patient recalls waking up during his last procedure and would like to prevent that this time.  Plan for deep sedation with MAC.  I have discussed the risks, alternatives, benefits with regards to but not limited to the risk of reaction to medication, bleeding, infection, perforation and the patient is agreeable to proceed. Written consent to be obtained.

## 2019-06-13 ENCOUNTER — Telehealth: Payer: Self-pay | Admitting: *Deleted

## 2019-06-13 NOTE — Telephone Encounter (Signed)
LMOVM

## 2019-06-14 NOTE — Telephone Encounter (Signed)
LMOVM letter mailed 

## 2019-06-16 ENCOUNTER — Encounter: Payer: Self-pay | Admitting: *Deleted

## 2019-06-16 ENCOUNTER — Telehealth: Payer: Self-pay | Admitting: Gastroenterology

## 2019-06-16 ENCOUNTER — Other Ambulatory Visit: Payer: Self-pay | Admitting: *Deleted

## 2019-06-16 DIAGNOSIS — Z86010 Personal history of colonic polyps: Secondary | ICD-10-CM

## 2019-06-16 MED ORDER — PEG 3350-KCL-NA BICARB-NACL 420 G PO SOLR: ORAL | 0 refills | Status: DC

## 2019-06-16 NOTE — Telephone Encounter (Signed)
Pt received letter to call. Please call [phone]

## 2019-06-16 NOTE — Telephone Encounter (Signed)
Called spoke with pt. He is scheduled for his procedure 3/2 at 7:30am. Aware will mail prep instructions with his pre-op/covid test appt. Confirmed mailing address. Rx sent in. Orders placed.

## 2019-07-06 DIAGNOSIS — F419 Anxiety disorder, unspecified: Secondary | ICD-10-CM | POA: Diagnosis not present

## 2019-08-16 ENCOUNTER — Ambulatory Visit: Payer: Self-pay | Attending: Internal Medicine

## 2019-08-16 ENCOUNTER — Other Ambulatory Visit: Payer: Self-pay

## 2019-08-16 DIAGNOSIS — Z20822 Contact with and (suspected) exposure to covid-19: Secondary | ICD-10-CM

## 2019-08-17 LAB — NOVEL CORONAVIRUS, NAA: SARS-CoV-2, NAA: NOT DETECTED

## 2019-08-22 ENCOUNTER — Telehealth: Payer: Self-pay

## 2019-08-22 ENCOUNTER — Encounter: Payer: Self-pay | Admitting: Gastroenterology

## 2019-08-22 NOTE — Telephone Encounter (Signed)
Noted  

## 2019-08-22 NOTE — Telephone Encounter (Signed)
Pt sent MyChart message requesting to cancel TCS that was for 09/13/19. He will contact office when he is ready to proceed. Informed endo scheduler.  FYI to LSL.

## 2019-09-09 ENCOUNTER — Other Ambulatory Visit (HOSPITAL_COMMUNITY): Payer: BC Managed Care – PPO

## 2019-09-13 ENCOUNTER — Encounter (HOSPITAL_COMMUNITY): Payer: Self-pay

## 2019-09-13 ENCOUNTER — Ambulatory Visit (HOSPITAL_COMMUNITY): Admit: 2019-09-13 | Payer: BC Managed Care – PPO | Admitting: Gastroenterology

## 2019-09-13 SURGERY — COLONOSCOPY WITH PROPOFOL
Anesthesia: Monitor Anesthesia Care

## 2020-02-10 DIAGNOSIS — Z125 Encounter for screening for malignant neoplasm of prostate: Secondary | ICD-10-CM | POA: Diagnosis not present

## 2020-02-10 DIAGNOSIS — F419 Anxiety disorder, unspecified: Secondary | ICD-10-CM | POA: Diagnosis not present

## 2020-02-10 DIAGNOSIS — E291 Testicular hypofunction: Secondary | ICD-10-CM | POA: Diagnosis not present

## 2020-02-10 DIAGNOSIS — Z0000 Encounter for general adult medical examination without abnormal findings: Secondary | ICD-10-CM | POA: Diagnosis not present

## 2020-02-10 DIAGNOSIS — E7849 Other hyperlipidemia: Secondary | ICD-10-CM | POA: Diagnosis not present

## 2020-02-10 DIAGNOSIS — E782 Mixed hyperlipidemia: Secondary | ICD-10-CM | POA: Diagnosis not present

## 2020-02-10 DIAGNOSIS — Z1389 Encounter for screening for other disorder: Secondary | ICD-10-CM | POA: Diagnosis not present

## 2020-02-10 DIAGNOSIS — F17290 Nicotine dependence, other tobacco product, uncomplicated: Secondary | ICD-10-CM | POA: Diagnosis not present

## 2020-02-10 DIAGNOSIS — Z6824 Body mass index (BMI) 24.0-24.9, adult: Secondary | ICD-10-CM | POA: Diagnosis not present

## 2020-07-23 DIAGNOSIS — Z1152 Encounter for screening for COVID-19: Secondary | ICD-10-CM | POA: Diagnosis not present

## 2020-10-04 DIAGNOSIS — F419 Anxiety disorder, unspecified: Secondary | ICD-10-CM | POA: Diagnosis not present

## 2020-10-05 DIAGNOSIS — M542 Cervicalgia: Secondary | ICD-10-CM | POA: Diagnosis not present

## 2020-10-29 DIAGNOSIS — M542 Cervicalgia: Secondary | ICD-10-CM | POA: Diagnosis not present

## 2020-11-05 DIAGNOSIS — M5030 Other cervical disc degeneration, unspecified cervical region: Secondary | ICD-10-CM | POA: Diagnosis not present

## 2020-11-20 DIAGNOSIS — M5412 Radiculopathy, cervical region: Secondary | ICD-10-CM | POA: Diagnosis not present

## 2020-12-06 DIAGNOSIS — M5412 Radiculopathy, cervical region: Secondary | ICD-10-CM | POA: Diagnosis not present

## 2021-01-07 DIAGNOSIS — M542 Cervicalgia: Secondary | ICD-10-CM | POA: Diagnosis not present

## 2021-01-18 DIAGNOSIS — M542 Cervicalgia: Secondary | ICD-10-CM | POA: Diagnosis not present

## 2021-01-22 DIAGNOSIS — M542 Cervicalgia: Secondary | ICD-10-CM | POA: Diagnosis not present

## 2021-03-05 DIAGNOSIS — M5412 Radiculopathy, cervical region: Secondary | ICD-10-CM | POA: Diagnosis not present

## 2021-04-09 DIAGNOSIS — M5412 Radiculopathy, cervical region: Secondary | ICD-10-CM | POA: Diagnosis not present

## 2021-04-22 DIAGNOSIS — Z13220 Encounter for screening for lipoid disorders: Secondary | ICD-10-CM | POA: Diagnosis not present

## 2021-04-22 DIAGNOSIS — M5412 Radiculopathy, cervical region: Secondary | ICD-10-CM | POA: Diagnosis not present

## 2021-04-22 DIAGNOSIS — Z1331 Encounter for screening for depression: Secondary | ICD-10-CM | POA: Diagnosis not present

## 2021-04-22 DIAGNOSIS — Z0001 Encounter for general adult medical examination with abnormal findings: Secondary | ICD-10-CM | POA: Diagnosis not present

## 2021-04-22 DIAGNOSIS — M5020 Other cervical disc displacement, unspecified cervical region: Secondary | ICD-10-CM | POA: Diagnosis not present

## 2021-04-22 DIAGNOSIS — F419 Anxiety disorder, unspecified: Secondary | ICD-10-CM | POA: Diagnosis not present

## 2021-04-22 DIAGNOSIS — E291 Testicular hypofunction: Secondary | ICD-10-CM | POA: Diagnosis not present

## 2021-04-22 DIAGNOSIS — E663 Overweight: Secondary | ICD-10-CM | POA: Diagnosis not present

## 2021-04-22 DIAGNOSIS — Z6824 Body mass index (BMI) 24.0-24.9, adult: Secondary | ICD-10-CM | POA: Diagnosis not present

## 2021-04-23 DIAGNOSIS — M542 Cervicalgia: Secondary | ICD-10-CM | POA: Diagnosis not present

## 2021-05-14 DIAGNOSIS — M5412 Radiculopathy, cervical region: Secondary | ICD-10-CM | POA: Diagnosis not present

## 2021-05-15 HISTORY — PX: NECK SURGERY: SHX720

## 2021-05-17 DIAGNOSIS — M50121 Cervical disc disorder at C4-C5 level with radiculopathy: Secondary | ICD-10-CM | POA: Diagnosis not present

## 2021-07-01 DIAGNOSIS — Z4889 Encounter for other specified surgical aftercare: Secondary | ICD-10-CM | POA: Diagnosis not present

## 2021-07-04 DIAGNOSIS — M542 Cervicalgia: Secondary | ICD-10-CM | POA: Diagnosis not present

## 2021-07-18 DIAGNOSIS — M542 Cervicalgia: Secondary | ICD-10-CM | POA: Diagnosis not present

## 2021-07-26 DIAGNOSIS — M542 Cervicalgia: Secondary | ICD-10-CM | POA: Diagnosis not present

## 2021-07-31 DIAGNOSIS — M542 Cervicalgia: Secondary | ICD-10-CM | POA: Diagnosis not present

## 2021-08-07 DIAGNOSIS — M542 Cervicalgia: Secondary | ICD-10-CM | POA: Diagnosis not present

## 2021-08-12 DIAGNOSIS — M25511 Pain in right shoulder: Secondary | ICD-10-CM | POA: Diagnosis not present

## 2021-08-12 DIAGNOSIS — Z4889 Encounter for other specified surgical aftercare: Secondary | ICD-10-CM | POA: Diagnosis not present

## 2021-11-08 DIAGNOSIS — Z4889 Encounter for other specified surgical aftercare: Secondary | ICD-10-CM | POA: Diagnosis not present

## 2021-12-12 DIAGNOSIS — F419 Anxiety disorder, unspecified: Secondary | ICD-10-CM | POA: Diagnosis not present

## 2021-12-12 DIAGNOSIS — E291 Testicular hypofunction: Secondary | ICD-10-CM | POA: Diagnosis not present

## 2022-02-28 DIAGNOSIS — J069 Acute upper respiratory infection, unspecified: Secondary | ICD-10-CM | POA: Diagnosis not present

## 2022-02-28 DIAGNOSIS — Z6824 Body mass index (BMI) 24.0-24.9, adult: Secondary | ICD-10-CM | POA: Diagnosis not present

## 2022-03-25 ENCOUNTER — Ambulatory Visit
Admission: EM | Admit: 2022-03-25 | Discharge: 2022-03-25 | Disposition: A | Discharge status: Home / Self Care | Payer: BC Managed Care – PPO | Attending: Nurse Practitioner | Admitting: Nurse Practitioner

## 2022-03-25 ENCOUNTER — Encounter: Payer: Self-pay | Admitting: Emergency Medicine

## 2022-03-25 ENCOUNTER — Other Ambulatory Visit: Payer: Self-pay

## 2022-03-25 DIAGNOSIS — R42 Dizziness and giddiness: Secondary | ICD-10-CM

## 2022-03-25 MED ORDER — MECLIZINE HCL 12.5 MG PO TABS: 12.5000 mg | ORAL_TABLET | Freq: Three times a day (TID) | ORAL | 0 refills | Status: DC | PRN

## 2022-03-25 NOTE — Discharge Instructions (Addendum)
-   EKG today is normal, orthostatic vital signs do not suggest dehydration - We will call you if any of the blood work comes back abnormal - Please increase water intake to ~64 oz daily - You can try meclizine 12.5 mg up to 3 times daily to help with the dizziness sensation - If not improved toward the end of the week, follow up with your PCP

## 2022-03-25 NOTE — ED Provider Notes (Signed)
[facility]    CSN: [id number] Arrival date & time: 03/25/22  1354      History   Chief Complaint No chief complaint on file.   HPI Scott Buck is a 56 y.o. male.   Patient presents for 2 days of dizziness/off-balance sensation.  Denies room spinning sensation.  Reports the dizziness is worse with rapid head movement, bending over, or going from sitting to standing.  Reports the symptoms started on Sunday morning when he woke up.  Reports a history of migraines and "vision episodes" prior to the migraine.  Reports he had a "vision episode" Saturday evening.  Denies worsening of symptoms when rolling over in bed.  Denies chest pain, shortness of breath, blurred vision or double vision with the dizzy episodes.  Has not fallen down or felt like he was going to pass out.  No syncope.  No headache, aural fullness, tinnitus, nausea/vomiting with episodes.  No unsteady gait, trouble with his speech during the episodes.  Denies any recent fall, accident, or head trauma.  No trouble with his speech, altered mental status, or trouble swallowing with episodes.  No pallor or diaphoresis or palpitations.    Past Medical History:  Diagnosis Date   Anxiety    Hypercholesteremia    Hypertension     Patient Active Problem List   Diagnosis Date Noted   History of colonic polyps 06/08/2019    Past Surgical History:  Procedure Laterality Date   COLONOSCOPY N/A 04/27/2014   Dr. Oneida Alar: 2 polyps removed, one was a simple adenoma.  Next colonoscopy 5 years.   KNEE SURGERY Right 1990   To repair right patella   NECK SURGERY  05/15/2021       Home Medications    Prior to Admission medications   Medication Sig Start Date End Date Taking? Authorizing Provider  meclizine (ANTIVERT) 12.5 MG tablet Take 1 tablet (12.5 mg total) by mouth 3 (three) times daily as needed for dizziness. Do not take with alcohol or while driving or operating heavy machinery 03/25/22  Yes Noemi Chapel  A, NP  acetaminophen (TYLENOL) 500 MG tablet Take 1,000 mg by mouth as needed.     [provider]  clonazePAM (KLONOPIN) 2 MG tablet Take 2 mg by mouth daily.    [provider]  ezetimibe-simvastatin (VYTORIN) 10-40 MG tablet Take 1 tablet by mouth daily. 03/19/19   [provider]  losartan (COZAAR) 50 MG tablet Take 50 mg by mouth daily.    [provider]  polyethylene glycol-electrolytes (NULYTELY/GOLYTELY) 420 g solution As directed 06/16/19   Danie Binder, MD    Family History Family History  Problem Relation Age of Onset   Cancer - Other Mother    Cancer - Lung Father    Emphysema Father    Pulmonary fibrosis Father    Colon polyps Father    Hypertension Sister    Hypercholesterolemia Sister    Colon polyps Sister    Hypertension Brother    Hypercholesterolemia Brother    Hypertension Brother    Hypercholesterolemia Brother    Colon cancer Neg Hx     Social History Social History   Tobacco Use   Smoking status: Every Day    Packs/day: 0.75    Years: 35.00    Total pack years: 26.25    Types: Cigarettes   Smokeless tobacco: Never  Substance Use Topics   Alcohol use: Yes    Comment: 2-3 beer daily   Drug use:  No     Allergies   Patient has no known allergies.   Review of Systems Review of Systems Per HPI  Physical Exam Triage Vital Signs ED Triage Vitals  Enc Vitals Group     BP 03/25/22 1512 129/85     Pulse Rate 03/25/22 1512 75     Resp 03/25/22 1512 16     Temp 03/25/22 1512 97.7 F (36.5 C)     Temp Source 03/25/22 1512 Oral     SpO2 03/25/22 1512 97 %     Weight --      Height --      Head Circumference --      Peak Flow --      Pain Score 03/25/22 1515 0     Pain Loc --      Pain Edu? --      Excl. in [place]? --    Orthostatic VS for the past 24 hrs:  BP- Lying Pulse- Lying BP- Sitting Pulse- Sitting BP- Standing at 0 minutes Pulse- Standing at 0 minutes  03/25/22 1517 142/89 72 147/90 74 151/90 76     Updated Vital Signs BP 129/85 (BP Location: Right Arm)   Pulse 75   Temp 97.7 F (36.5 C) (Oral)   Resp 16   SpO2 97%   Visual Acuity Right Eye Distance:   Left Eye Distance:   Bilateral Distance:    Right Eye Near:   Left Eye Near:    Bilateral Near:     Physical Exam Vitals and nursing note reviewed.  Constitutional:      General: He is not in acute distress.    Appearance: Normal appearance. He is not ill-appearing, toxic-appearing or diaphoretic.  HENT:     Head: Normocephalic and atraumatic.     Right Ear: Tympanic membrane, ear canal and external ear normal. There is no impacted cerumen.     Left Ear: Tympanic membrane, ear canal and external ear normal. There is no impacted cerumen.     Nose: Nose normal. No congestion or rhinorrhea.     Mouth/Throat:     Mouth: Mucous membranes are moist.     Pharynx: Oropharynx is clear.  Eyes:     General: No scleral icterus.    Extraocular Movements: Extraocular movements intact.     Pupils: Pupils are equal, round, and reactive to light.  Cardiovascular:     Rate and Rhythm: Normal rate and regular rhythm.  Pulmonary:     Effort: Pulmonary effort is normal. No respiratory distress.     Breath sounds: Normal breath sounds. No wheezing, rhonchi or rales.  Abdominal:     General: Abdomen is flat. Bowel sounds are normal. There is no distension.     Palpations: Abdomen is soft.     Tenderness: There is no abdominal tenderness. There is no right CVA tenderness, left CVA tenderness or guarding.  Musculoskeletal:     Cervical back: Normal range of motion and neck supple. No rigidity or tenderness.  Lymphadenopathy:     Cervical: No cervical adenopathy.  Skin:    General: Skin is warm and dry.     Capillary Refill: Capillary refill takes less than 2 seconds.     Coloration: Skin is not jaundiced or pale.     Findings: No erythema.  Neurological:     Mental Status: He is alert and oriented to person, place, and time.      Cranial Nerves: Cranial nerves 2-12 are intact. No facial asymmetry.  Sensory: Sensation is intact.     Motor: No weakness.     Coordination: Coordination is intact. Coordination normal. Heel to [facility] Test normal. Rapid alternating movements normal.     Gait: Gait is intact. Gait normal.  Psychiatric:        Behavior: Behavior is cooperative.      UC Treatments / Results  Labs (all labs ordered are listed, but only abnormal results are displayed) Labs Reviewed  CBC  COMPREHENSIVE METABOLIC PANEL    EKG   Radiology No results found.  Procedures Procedures (including critical care time)  Medications Ordered in UC Medications - No data to display  Initial Impression / Assessment and Plan / UC Course  I have reviewed the triage vital signs and the nursing notes.  Pertinent labs & imaging results that were available during my care of the patient were reviewed by me and considered in my medical decision making (see chart for details).    Patient is well-appearing, normotensive, afebrile, not tachycardic, not tachypneic, oxygenating well on room air.  Orthostatic vital signs are negative.  EKG is reassuring.  CBC and CMP checked to assess for anemia, altered kidney function or electrolytes or liver function.  Query vertigo versus dehydration.  Recommended increasing water intake to 64 with this daily.  Prescription given for meclizine to use cautiously up to 3 times a day as needed to help with dizziness.  Recommended follow-up with primary care provider later this week with no improvement.  ER precautions and return precautions discussed.  The patient was given the opportunity to ask questions.  All questions answered to their satisfaction.  The patient is in agreement to this plan.   Final Clinical Impressions(s) / UC Diagnoses   Final diagnoses:  Dizziness     Discharge Instructions      - EKG today is normal, orthostatic vital signs do not suggest dehydration - We will  call you if any of the blood work comes back abnormal - Please increase water intake to ~64 oz daily - You can try meclizine 12.5 mg up to 3 times daily to help with the dizziness sensation - If not improved toward the end of the week, follow up with your PCP     ED Prescriptions     Medication Sig Dispense Auth. Provider   meclizine (ANTIVERT) 12.5 MG tablet Take 1 tablet (12.5 mg total) by mouth 3 (three) times daily as needed for dizziness. Do not take with alcohol or while driving or operating heavy machinery 30 tablet Eulogio Bear, NP      PDMP not reviewed this encounter.   Eulogio Bear, NP 03/25/22 1744

## 2022-03-25 NOTE — ED Triage Notes (Signed)
Dx with post covid symptoms 1 month ago.  Was given antibiotic he finished 2 weeks ago for ear infection.  Sunday morning felt uneasy when he sat up in bed.  Symptom got better throughout the day.  Yesterday morning dizziness occurred again and has not gone away when standing up or looking down.

## 2022-03-26 LAB — COMPREHENSIVE METABOLIC PANEL
ALT: 39 IU/L (ref 0–44)
AST: 28 IU/L (ref 0–40)
Albumin/Globulin Ratio: 2.7 — ABNORMAL HIGH (ref 1.2–2.2)
Albumin: 5.1 g/dL — ABNORMAL HIGH (ref 3.8–4.9)
Alkaline Phosphatase: 49 IU/L (ref 44–121)
BUN/Creatinine Ratio: 15 (ref 9–20)
BUN: 14 mg/dL (ref 6–24)
Bilirubin Total: <0.2 mg/dL (ref 0.0–1.2)
CO2: 16 mmol/L — ABNORMAL LOW (ref 20–29)
Calcium: 9.5 mg/dL (ref 8.7–10.2)
Chloride: 104 mmol/L (ref 96–106)
Creatinine, Ser: 0.91 mg/dL (ref 0.76–1.27)
Globulin, Total: 1.9 g/dL (ref 1.5–4.5)
Glucose: 86 mg/dL (ref 70–99)
Potassium: 4.2 mmol/L (ref 3.5–5.2)
Sodium: 140 mmol/L (ref 134–144)
Total Protein: 7 g/dL (ref 6.0–8.5)
eGFR: 98 mL/min/{1.73_m2} (ref >59)

## 2022-03-26 LAB — CBC
Hematocrit: 46 % (ref 37.5–51.0)
Hemoglobin: 15.5 g/dL (ref 13.0–17.7)
MCH: 31.4 pg (ref 26.6–33.0)
MCHC: 33.7 g/dL (ref 31.5–35.7)
MCV: 93 fL (ref 79–97)
Platelets: 218 10*3/uL (ref 150–450)
RBC: 4.93 x10E6/uL (ref 4.14–5.80)
RDW: 13.1 % (ref 11.6–15.4)
WBC: 6.3 10*3/uL (ref 3.4–10.8)

## 2022-06-18 DIAGNOSIS — H5213 Myopia, bilateral: Secondary | ICD-10-CM | POA: Diagnosis not present

## 2022-09-15 ENCOUNTER — Encounter (HOSPITAL_COMMUNITY): Payer: Self-pay | Admitting: Internal Medicine

## 2022-09-15 ENCOUNTER — Other Ambulatory Visit (HOSPITAL_COMMUNITY): Payer: Self-pay | Admitting: Internal Medicine

## 2022-09-15 DIAGNOSIS — M5020 Other cervical disc displacement, unspecified cervical region: Secondary | ICD-10-CM | POA: Diagnosis not present

## 2022-09-15 DIAGNOSIS — R053 Chronic cough: Secondary | ICD-10-CM

## 2022-09-15 DIAGNOSIS — M5412 Radiculopathy, cervical region: Secondary | ICD-10-CM | POA: Diagnosis not present

## 2022-09-15 DIAGNOSIS — F419 Anxiety disorder, unspecified: Secondary | ICD-10-CM | POA: Diagnosis not present

## 2022-09-29 ENCOUNTER — Ambulatory Visit (HOSPITAL_COMMUNITY)
Admission: RE | Admit: 2022-09-29 | Discharge: 2022-09-29 | Disposition: A | Discharge status: Home / Self Care | Payer: BC Managed Care – PPO | Source: Ambulatory Visit | Attending: Internal Medicine | Admitting: Internal Medicine

## 2022-09-29 DIAGNOSIS — R059 Cough, unspecified: Secondary | ICD-10-CM | POA: Diagnosis not present

## 2022-09-29 DIAGNOSIS — R053 Chronic cough: Secondary | ICD-10-CM | POA: Diagnosis not present

## 2023-08-19 ENCOUNTER — Emergency Department (HOSPITAL_COMMUNITY): Payer: Managed Care, Other (non HMO)

## 2023-08-19 ENCOUNTER — Ambulatory Visit
Admission: EM | Admit: 2023-08-19 | Discharge: 2023-08-19 | Disposition: A | Discharge status: Other Acute Inpatient Hospital | Payer: Managed Care, Other (non HMO) | Attending: Nurse Practitioner | Admitting: Nurse Practitioner

## 2023-08-19 ENCOUNTER — Encounter: Payer: Self-pay | Admitting: Emergency Medicine

## 2023-08-19 ENCOUNTER — Encounter (HOSPITAL_COMMUNITY): Payer: Self-pay | Admitting: Internal Medicine

## 2023-08-19 ENCOUNTER — Observation Stay (HOSPITAL_COMMUNITY)
Admission: EM | Admit: 2023-08-19 | Discharge: 2023-08-20 | Disposition: A | Discharge status: Home / Self Care | Payer: Managed Care, Other (non HMO) | Attending: Internal Medicine | Admitting: Internal Medicine

## 2023-08-19 ENCOUNTER — Other Ambulatory Visit: Payer: Self-pay

## 2023-08-19 ENCOUNTER — Ambulatory Visit: Payer: BC Managed Care – PPO

## 2023-08-19 DIAGNOSIS — R0602 Shortness of breath: Secondary | ICD-10-CM

## 2023-08-19 DIAGNOSIS — J1001 Influenza due to other identified influenza virus with the same other identified influenza virus pneumonia: Secondary | ICD-10-CM | POA: Diagnosis present

## 2023-08-19 DIAGNOSIS — R7981 Abnormal blood-gas level: Secondary | ICD-10-CM

## 2023-08-19 DIAGNOSIS — E876 Hypokalemia: Secondary | ICD-10-CM | POA: Diagnosis present

## 2023-08-19 DIAGNOSIS — J101 Influenza due to other identified influenza virus with other respiratory manifestations: Secondary | ICD-10-CM | POA: Diagnosis present

## 2023-08-19 DIAGNOSIS — Z825 Family history of asthma and other chronic lower respiratory diseases: Secondary | ICD-10-CM

## 2023-08-19 DIAGNOSIS — J9601 Acute respiratory failure with hypoxia: Principal | ICD-10-CM | POA: Diagnosis present

## 2023-08-19 DIAGNOSIS — E7800 Pure hypercholesterolemia, unspecified: Secondary | ICD-10-CM | POA: Diagnosis present

## 2023-08-19 DIAGNOSIS — J09X9 Influenza due to identified novel influenza A virus with other manifestations: Secondary | ICD-10-CM | POA: Insufficient documentation

## 2023-08-19 DIAGNOSIS — Z83719 Family history of colon polyps, unspecified: Secondary | ICD-10-CM

## 2023-08-19 DIAGNOSIS — F17210 Nicotine dependence, cigarettes, uncomplicated: Secondary | ICD-10-CM | POA: Diagnosis present

## 2023-08-19 DIAGNOSIS — I959 Hypotension, unspecified: Secondary | ICD-10-CM

## 2023-08-19 DIAGNOSIS — E871 Hypo-osmolality and hyponatremia: Secondary | ICD-10-CM | POA: Diagnosis present

## 2023-08-19 DIAGNOSIS — J441 Chronic obstructive pulmonary disease with (acute) exacerbation: Secondary | ICD-10-CM | POA: Diagnosis not present

## 2023-08-19 DIAGNOSIS — Z8349 Family history of other endocrine, nutritional and metabolic diseases: Secondary | ICD-10-CM

## 2023-08-19 DIAGNOSIS — Z1152 Encounter for screening for COVID-19: Secondary | ICD-10-CM | POA: Diagnosis not present

## 2023-08-19 DIAGNOSIS — I10 Essential (primary) hypertension: Secondary | ICD-10-CM | POA: Diagnosis present

## 2023-08-19 DIAGNOSIS — E860 Dehydration: Principal | ICD-10-CM | POA: Diagnosis present

## 2023-08-19 DIAGNOSIS — N179 Acute kidney failure, unspecified: Secondary | ICD-10-CM | POA: Diagnosis present

## 2023-08-19 DIAGNOSIS — J440 Chronic obstructive pulmonary disease with acute lower respiratory infection: Secondary | ICD-10-CM | POA: Diagnosis present

## 2023-08-19 DIAGNOSIS — R0902 Hypoxemia: Secondary | ICD-10-CM

## 2023-08-19 DIAGNOSIS — Z8249 Family history of ischemic heart disease and other diseases of the circulatory system: Secondary | ICD-10-CM | POA: Diagnosis not present

## 2023-08-19 DIAGNOSIS — Z79899 Other long term (current) drug therapy: Secondary | ICD-10-CM

## 2023-08-19 DIAGNOSIS — F419 Anxiety disorder, unspecified: Secondary | ICD-10-CM | POA: Diagnosis present

## 2023-08-19 DIAGNOSIS — F1010 Alcohol abuse, uncomplicated: Secondary | ICD-10-CM | POA: Diagnosis present

## 2023-08-19 DIAGNOSIS — R059 Cough, unspecified: Secondary | ICD-10-CM

## 2023-08-19 LAB — CBC WITH DIFFERENTIAL/PLATELET
Abs Immature Granulocytes: 0.03 10*3/uL (ref 0.00–0.07)
Basophils Absolute: 0 10*3/uL (ref 0.0–0.1)
Basophils Relative: 0 %
Eosinophils Absolute: 0 10*3/uL (ref 0.0–0.5)
Eosinophils Relative: 0 %
HCT: 39.8 % (ref 39.0–52.0)
Hemoglobin: 13.9 g/dL (ref 13.0–17.0)
Immature Granulocytes: 1 %
Lymphocytes Relative: 8 %
Lymphs Abs: 0.4 10*3/uL — ABNORMAL LOW (ref 0.7–4.0)
MCH: 33.2 pg (ref 26.0–34.0)
MCHC: 34.9 g/dL (ref 30.0–36.0)
MCV: 95 fL (ref 80.0–100.0)
Monocytes Absolute: 0.5 10*3/uL (ref 0.1–1.0)
Monocytes Relative: 9 %
Neutro Abs: 4.5 10*3/uL (ref 1.7–7.7)
Neutrophils Relative %: 82 %
Platelets: 148 10*3/uL — ABNORMAL LOW (ref 150–400)
RBC: 4.19 MIL/uL — ABNORMAL LOW (ref 4.22–5.81)
RDW: 12.5 % (ref 11.5–15.5)
WBC: 5.5 10*3/uL (ref 4.0–10.5)
nRBC: 0 % (ref 0.0–0.2)

## 2023-08-19 LAB — COMPREHENSIVE METABOLIC PANEL
ALT: 160 U/L — ABNORMAL HIGH (ref 0–44)
AST: 275 U/L — ABNORMAL HIGH (ref 15–41)
Albumin: 3.1 g/dL — ABNORMAL LOW (ref 3.5–5.0)
Alkaline Phosphatase: 40 U/L (ref 38–126)
Anion gap: 14 (ref 5–15)
BUN: 33 mg/dL — ABNORMAL HIGH (ref 6–20)
CO2: 22 mmol/L (ref 22–32)
Calcium: 8.4 mg/dL — ABNORMAL LOW (ref 8.9–10.3)
Chloride: 94 mmol/L — ABNORMAL LOW (ref 98–111)
Creatinine, Ser: 1.58 mg/dL — ABNORMAL HIGH (ref 0.61–1.24)
GFR, Estimated: 50 mL/min — ABNORMAL LOW (ref >60)
Glucose, Bld: 97 mg/dL (ref 70–99)
Potassium: 3.3 mmol/L — ABNORMAL LOW (ref 3.5–5.1)
Sodium: 130 mmol/L — ABNORMAL LOW (ref 135–145)
Total Bilirubin: 1.1 mg/dL (ref 0.0–1.2)
Total Protein: 6.5 g/dL (ref 6.5–8.1)

## 2023-08-19 LAB — POC COVID19/FLU A&B COMBO
Covid Antigen, POC: NEGATIVE
Influenza A Antigen, POC: NEGATIVE
Influenza B Antigen, POC: NEGATIVE

## 2023-08-19 LAB — RESP PANEL BY RT-PCR (RSV, FLU A&B, COVID)  RVPGX2
Influenza A by PCR: POSITIVE — AB
Influenza B by PCR: NEGATIVE
Resp Syncytial Virus by PCR: NEGATIVE
SARS Coronavirus 2 by RT PCR: NEGATIVE

## 2023-08-19 LAB — PROCALCITONIN: Procalcitonin: 0.57 ng/mL

## 2023-08-19 LAB — LACTIC ACID, PLASMA
Lactic Acid, Venous: 1.8 mmol/L (ref 0.5–1.9)
Lactic Acid, Venous: 1.7 mmol/L (ref 0.5–1.9)

## 2023-08-19 LAB — HIV ANTIBODY (ROUTINE TESTING W REFLEX): HIV Screen 4th Generation wRfx: NONREACTIVE

## 2023-08-19 MED ORDER — METHYLPREDNISOLONE SODIUM SUCC 40 MG IJ SOLR
40.0000 mg | Freq: Two times a day (BID) | INTRAMUSCULAR | Status: DC
  Administered 2023-08-19 – 2023-08-20 (×2): 40 mg via INTRAVENOUS
  Filled 2023-08-19 (×2): qty 1

## 2023-08-19 MED ORDER — THIAMINE HCL 100 MG/ML IJ SOLN: 100.0000 mg | Freq: Every day | INTRAMUSCULAR | Status: DC

## 2023-08-19 MED ORDER — BUDESONIDE 0.25 MG/2ML IN SUSP
0.2500 mg | Freq: Two times a day (BID) | RESPIRATORY_TRACT | Status: DC
  Administered 2023-08-20: 0.25 mg via RESPIRATORY_TRACT
  Filled 2023-08-19: qty 2

## 2023-08-19 MED ORDER — FOLIC ACID 1 MG PO TABS
1.0000 mg | ORAL_TABLET | Freq: Every day | ORAL | Status: DC
  Administered 2023-08-19 – 2023-08-20 (×2): 1 mg via ORAL
  Filled 2023-08-19 (×2): qty 1

## 2023-08-19 MED ORDER — IPRATROPIUM-ALBUTEROL 0.5-2.5 (3) MG/3ML IN SOLN
3.0000 mL | Freq: Once | RESPIRATORY_TRACT | Status: AC
  Administered 2023-08-19: 3 mL via RESPIRATORY_TRACT

## 2023-08-19 MED ORDER — LORAZEPAM 1 MG PO TABS: 1.0000 mg | ORAL_TABLET | ORAL | Status: DC | PRN

## 2023-08-19 MED ORDER — LORAZEPAM 2 MG/ML IJ SOLN: 1.0000 mg | INTRAMUSCULAR | Status: DC | PRN

## 2023-08-19 MED ORDER — LACTATED RINGERS IV BOLUS (SEPSIS)
1000.0000 mL | Freq: Once | INTRAVENOUS | Status: AC
  Administered 2023-08-19: 1000 mL via INTRAVENOUS

## 2023-08-19 MED ORDER — SODIUM CHLORIDE 0.9 % IV SOLN
500.0000 mg | Freq: Once | INTRAVENOUS | Status: AC
  Administered 2023-08-19: 500 mg via INTRAVENOUS
  Filled 2023-08-19: qty 5

## 2023-08-19 MED ORDER — OSELTAMIVIR PHOSPHATE 30 MG PO CAPS
30.0000 mg | ORAL_CAPSULE | Freq: Two times a day (BID) | ORAL | Status: DC
  Administered 2023-08-19 – 2023-08-20 (×3): 30 mg via ORAL
  Filled 2023-08-19 (×3): qty 1

## 2023-08-19 MED ORDER — SODIUM CHLORIDE 0.9 % BOLUS PEDS: 1000.0000 mL | Freq: Once | INTRAVENOUS | Status: DC

## 2023-08-19 MED ORDER — SODIUM CHLORIDE 0.9 % IV SOLN: INTRAVENOUS | Status: AC

## 2023-08-19 MED ORDER — ACETAMINOPHEN 650 MG RE SUPP: 650.0000 mg | Freq: Four times a day (QID) | RECTAL | Status: DC | PRN

## 2023-08-19 MED ORDER — ONDANSETRON HCL 4 MG PO TABS: 4.0000 mg | ORAL_TABLET | Freq: Four times a day (QID) | ORAL | Status: DC | PRN

## 2023-08-19 MED ORDER — DM-GUAIFENESIN ER 30-600 MG PO TB12
1.0000 | ORAL_TABLET | Freq: Two times a day (BID) | ORAL | Status: DC
  Administered 2023-08-19 – 2023-08-20 (×3): 1 via ORAL
  Filled 2023-08-19 (×3): qty 1

## 2023-08-19 MED ORDER — POTASSIUM CHLORIDE CRYS ER 20 MEQ PO TBCR
40.0000 meq | EXTENDED_RELEASE_TABLET | Freq: Once | ORAL | Status: AC
  Administered 2023-08-19: 40 meq via ORAL
  Filled 2023-08-19: qty 2

## 2023-08-19 MED ORDER — SIMVASTATIN 20 MG PO TABS
40.0000 mg | ORAL_TABLET | Freq: Every day | ORAL | Status: DC
  Administered 2023-08-20: 40 mg via ORAL
  Filled 2023-08-19: qty 4
  Filled 2023-08-19: qty 2

## 2023-08-19 MED ORDER — SODIUM CHLORIDE 0.9 % IV SOLN
2.0000 g | Freq: Once | INTRAVENOUS | Status: AC
  Administered 2023-08-19: 2 g via INTRAVENOUS
  Filled 2023-08-19: qty 20

## 2023-08-19 MED ORDER — IPRATROPIUM-ALBUTEROL 0.5-2.5 (3) MG/3ML IN SOLN: 3.0000 mL | Freq: Four times a day (QID) | RESPIRATORY_TRACT | Status: DC | PRN

## 2023-08-19 MED ORDER — IOHEXOL 350 MG/ML SOLN
75.0000 mL | Freq: Once | INTRAVENOUS | Status: AC | PRN
  Administered 2023-08-19: 75 mL via INTRAVENOUS

## 2023-08-19 MED ORDER — EZETIMIBE 10 MG PO TABS
10.0000 mg | ORAL_TABLET | Freq: Every day | ORAL | Status: DC
  Filled 2023-08-19: qty 1

## 2023-08-19 MED ORDER — ACETAMINOPHEN 325 MG PO TABS: 650.0000 mg | ORAL_TABLET | Freq: Four times a day (QID) | ORAL | Status: DC | PRN

## 2023-08-19 MED ORDER — METHYLPREDNISOLONE SODIUM SUCC 125 MG IJ SOLR
125.0000 mg | Freq: Once | INTRAMUSCULAR | Status: AC
  Administered 2023-08-19: 125 mg via INTRAMUSCULAR

## 2023-08-19 MED ORDER — SODIUM CHLORIDE 0.9 % IV BOLUS
1000.0000 mL | Freq: Once | INTRAVENOUS | Status: AC
  Administered 2023-08-19: 1000 mL via INTRAVENOUS

## 2023-08-19 MED ORDER — THIAMINE MONONITRATE 100 MG PO TABS
100.0000 mg | ORAL_TABLET | Freq: Every day | ORAL | Status: DC
  Administered 2023-08-19 – 2023-08-20 (×2): 100 mg via ORAL
  Filled 2023-08-19 (×2): qty 1

## 2023-08-19 MED ORDER — PANTOPRAZOLE SODIUM 40 MG PO TBEC
40.0000 mg | DELAYED_RELEASE_TABLET | Freq: Every day | ORAL | Status: DC
  Administered 2023-08-19 – 2023-08-20 (×2): 40 mg via ORAL
  Filled 2023-08-19 (×2): qty 1

## 2023-08-19 MED ORDER — ADULT MULTIVITAMIN W/MINERALS CH
1.0000 | ORAL_TABLET | Freq: Every day | ORAL | Status: DC
  Administered 2023-08-19 – 2023-08-20 (×2): 1 via ORAL
  Filled 2023-08-19 (×2): qty 1

## 2023-08-19 MED ORDER — CLONAZEPAM 0.5 MG PO TABS
2.0000 mg | ORAL_TABLET | Freq: Every day | ORAL | Status: DC
  Administered 2023-08-19 – 2023-08-20 (×2): 2 mg via ORAL
  Filled 2023-08-19 (×3): qty 4

## 2023-08-19 MED ORDER — EZETIMIBE-SIMVASTATIN 10-40 MG PO TABS: 1.0000 | ORAL_TABLET | Freq: Every day | ORAL | Status: DC

## 2023-08-19 MED ORDER — ONDANSETRON HCL 4 MG/2ML IJ SOLN: 4.0000 mg | Freq: Four times a day (QID) | INTRAMUSCULAR | Status: DC | PRN

## 2023-08-19 NOTE — ED Notes (Signed)
 Patient transported to X-ray

## 2023-08-19 NOTE — ED Triage Notes (Addendum)
 Pt states he went to UC this AM for cough, congestion, dizziness and weakness x4 days. Pt was found to be hypotensive 84/60 and was sent to ED for evaluation. Pt is A&O in triage.   88% on RA, placed on 2L [place]

## 2023-08-19 NOTE — ED Notes (Signed)
 Report called to [facility] ED.

## 2023-08-19 NOTE — ED Triage Notes (Addendum)
Pt reports fever, nausea, cough, chest congestion, emesis x4 days. Pt alert and oriented, steady gait but reports generalized weakness and fatigue.

## 2023-08-19 NOTE — Sepsis Progress Note (Signed)
 eLink is following this Code Sepsis.

## 2023-08-19 NOTE — ED Provider Notes (Signed)
 [facility]    CSN: [id number] Arrival date & time: 08/19/23  0802      History   Chief Complaint Chief Complaint  Patient presents with   Cough    HPI Scott Buck is a 58 y.o. male.   The history is provided by the patient.   Patient with a 4-day history of fever, chills, fatigue, cough, chest and congestion, wheezing, shortness of breath nausea, vomiting, and diarrhea.  Fever was subjective at home, last fever was 3 days ago, nausea, vomiting, and diarrhea, has also resolved per patient.  Patient continues to complain of cough, fatigue, and wheezing.  Denies headache, ear pain, sore throat, difficulty breathing, chest pain, abdominal pain, or rash.  Patient reports he has been taking several over-the-counter cough and cold medications with minimal relief of his symptoms.  Patient reports that he stopped smoking 3 days ago, reports 45-year smoking history, reports that he smoked at least half a pack per day.  Denies history of asthma.  Past Medical History:  Diagnosis Date   Anxiety    Hypercholesteremia    Hypertension     Patient Active Problem List   Diagnosis Date Noted   History of colonic polyps 06/08/2019    Past Surgical History:  Procedure Laterality Date   COLONOSCOPY N/A 04/27/2014   Dr. Harvey: 2 polyps removed, one was a simple adenoma.  Next colonoscopy 5 years.   KNEE SURGERY Right 1990   To repair right patella   NECK SURGERY  05/15/2021       Home Medications    Prior to Admission medications   Medication Sig Start Date End Date Taking? Authorizing Provider  acetaminophen  (TYLENOL ) 500 MG tablet Take 1,000 mg by mouth as needed.     [provider]  clonazePAM  (KLONOPIN ) 2 MG tablet Take 2 mg by mouth daily.    [provider]  ezetimibe -simvastatin  (VYTORIN ) 10-40 MG tablet Take 1 tablet by mouth daily. 03/19/19   [provider]  losartan (COZAAR) 50 MG tablet Take 50 mg by mouth daily.    [provider]  meclizine  (ANTIVERT ) 12.5 MG tablet Take 1 tablet (12.5 mg total) by mouth 3 (three) times daily as needed for dizziness. Do not take with alcohol or while driving or operating heavy machinery Patient not taking: Reported on 08/19/2023 03/25/22   Chandra Harlene LABOR, NP  polyethylene glycol-electrolytes (NULYTELY/GOLYTELY) 420 g solution As directed Patient not taking: Reported on 08/19/2023 06/16/19   Harvey Margo LITTIE, MD    Family History Family History  Problem Relation Age of Onset   Cancer - Other Mother    Cancer - Lung Father    Emphysema Father    Pulmonary fibrosis Father    Colon polyps Father    Hypertension Sister    Hypercholesterolemia Sister    Colon polyps Sister    Hypertension Brother    Hypercholesterolemia Brother    Hypertension Brother    Hypercholesterolemia Brother    Colon cancer Neg Hx     Social History Social History   Tobacco Use   Smoking status: Former    Current packs/day: 0.00    Average packs/day: 0.8 packs/day for 35.0 years (26.3 ttl pk-yrs)    Types: Cigarettes    Quit date: 08/15/2023    Years since quitting: 0.0   Smokeless tobacco: Never  Substance Use Topics   Alcohol use: Yes    Comment: 2-3 beer daily   Drug use: No  Allergies   Patient has no known allergies.   Review of Systems Review of Systems Per HPI  Physical Exam Triage Vital Signs ED Triage Vitals  Encounter Vitals Group     BP --      Systolic BP Percentile --      Diastolic BP Percentile --      Pulse Rate 08/19/23 0838 (!) 115     Resp 08/19/23 0838 20     Temp 08/19/23 0838 97.9 F (36.6 C)     Temp Source 08/19/23 0838 Oral     SpO2 --      Weight --      Height --      Head Circumference --      Peak Flow --      Pain Score 08/19/23 0836 4     Pain Loc --      Pain Education --      Exclude from Growth Chart --    No data found.  Updated Vital Signs BP (!) 82/62 (BP Location: Right Arm) Comment: NP aware and at bedside.  highest reading post 2 automatic bp reading.  Pulse (!) 115   Temp 97.9 F (36.6 C) (Oral)   Resp 20   SpO2 (!) 89%   Visual Acuity Right Eye Distance:   Left Eye Distance:   Bilateral Distance:    Right Eye Near:   Left Eye Near:    Bilateral Near:     Physical Exam Vitals and nursing note reviewed.  Constitutional:      General: He is not in acute distress.    Appearance: Normal appearance.  HENT:     Head: Normocephalic.     Right Ear: Tympanic membrane, ear canal and external ear normal.     Left Ear: Tympanic membrane, ear canal and external ear normal.     Nose: Nose normal.     Mouth/Throat:     Mouth: Mucous membranes are moist.  Eyes:     Extraocular Movements: Extraocular movements intact.     Conjunctiva/sclera: Conjunctivae normal.     Pupils: Pupils are equal, round, and reactive to light.  Cardiovascular:     Rate and Rhythm: Normal rate and regular rhythm.     Pulses: Normal pulses.     Heart sounds: Normal heart sounds.  Pulmonary:     Effort: Pulmonary effort is normal.     Breath sounds: Examination of the right-upper field reveals wheezing and rhonchi. Examination of the left-upper field reveals wheezing and rhonchi. Examination of the right-lower field reveals wheezing and rhonchi. Examination of the left-lower field reveals wheezing and rhonchi. Wheezing and rhonchi present.  Abdominal:     General: Bowel sounds are normal.     Palpations: Abdomen is soft.     Tenderness: There is no abdominal tenderness.  Musculoskeletal:     Cervical back: Normal range of motion.  Lymphadenopathy:     Cervical: No cervical adenopathy.  Skin:    General: Skin is warm and dry.  Neurological:     General: No focal deficit present.     Mental Status: He is alert and oriented to person, place, and time.  Psychiatric:        Mood and Affect: Mood normal.        Behavior: Behavior normal.     UC Treatments / Results  Labs (all labs ordered are listed, but  only abnormal results are displayed) Labs Reviewed  POC COVID19/FLU A&B COMBO  EKG   Radiology No results found.  Procedures Procedures (including critical care time)  Medications Ordered in UC Medications  methylPREDNISolone  sodium succinate (SOLU-MEDROL ) 125 mg/2 mL injection 125 mg (has no administration in time range)    Initial Impression / Assessment and Plan / UC Course  I have reviewed the triage vital signs and the nursing notes.  Pertinent labs & imaging results that were available during my care of the patient were reviewed by me and considered in my medical decision making (see chart for details).  DuoNeb and Solu-Medrol  125 mg IM were administered with minimal relief of symptoms.  Attempted to perform chest x-ray; however, patient began to complain that he was feeling worse.  Repeat vital signs BP 76/53, heart rate 125, respiratory rate of 22.  Sats did improve up to 92%.  Because patient's BP continues to drop, along with tachycardia, recommend further evaluation in the emergency department.# 20-gauge PIV established, along with normal saline bolus.  O2 at 2 L via nasal cannula placed for low O2 sats.  There is concern for patient's hypotension with corresponding tachycardia.  Discussed findings with patient and advised it is recommended that he be seen in the emergency department for further evaluation.  Patient was advised that it is recommended that he be transported to the emergency department via EMS given his current vital signs.  Patient was in agreement with this plan of care.  EMS on scene to transport patient to the emergency department.  Patient's spouse was also notified via telephone regarding disposition.  Final Clinical Impressions(s) / UC Diagnoses   Final diagnoses:  Cough, unspecified type   Discharge Instructions   None    ED Prescriptions   None    PDMP not reviewed this encounter.   Gilmer Etta PARAS, NP 08/19/23 1126

## 2023-08-19 NOTE — ED Notes (Signed)
 Patient transported to CT

## 2023-08-19 NOTE — ED Notes (Addendum)
 Patient is being discharged from the Urgent Care and sent to the Emergency Department via EMS . Per NP, patient is in need of higher level of care due to hypotension/hypoxia. Patient is aware and verbalizes understanding of plan of care.  Vitals:   08/19/23 0910 08/19/23 0934  BP: (!) 79/54 (!) 76/53  Pulse: (!) 125   Resp: (!) 22   Temp:    SpO2: 92% 92%   Pt spouse contacted prior to transport. EMS at bedside and pt transferred to ED.

## 2023-08-19 NOTE — Discharge Instructions (Signed)
Patient transported to the emergency department via EMS for further evaluation.

## 2023-08-19 NOTE — ED Provider Notes (Signed)
 [place] [facility] AT [facility] Provider Note   CSN: [id number] Arrival date & time: 08/19/23  9052     History  Chief Complaint  Patient presents with   Hypotension    Scott Buck is a 58 y.o. male.  58 year old male with a history of tobacco use who presents to the emergency department with fevers, chills, fatigue, cough, nausea vomiting and diarrhea.  Patient reports that on Saturday he started feeling sick.  Had a subjective fever but did not take his temperature.  Since then has had a dry cough and some mild abdominal pain from the coughing.  Also reports nausea vomiting and diarrhea that has resolved.  Went to urgent care today and had a negative COVID and flu.  Was given 125 Solu-Medrol  IM and a DuoNeb for some wheezing which she says moderately improved symptoms and then was referred to the emergency department.       Home Medications Prior to Admission medications   Medication Sig Start Date End Date Taking? Authorizing Provider  acetaminophen  (TYLENOL ) 500 MG tablet Take 1,000 mg by mouth as needed.    Yes [provider]  clonazePAM  (KLONOPIN ) 2 MG tablet Take 2 mg by mouth daily.   Yes [provider]  ezetimibe -simvastatin  (VYTORIN ) 10-40 MG tablet Take 1 tablet by mouth daily. 03/19/19  Yes [provider]  ibuprofen (ADVIL) 200 MG tablet Take 400 mg by mouth every 6 (six) hours as needed for moderate pain (pain score 4-6).   Yes [provider]  losartan (COZAAR) 50 MG tablet Take 50 mg by mouth daily.   Yes [provider]  polyethylene glycol-electrolytes (NULYTELY/GOLYTELY) 420 g solution As directed Patient not taking: Reported on 08/19/2023 06/16/19   Harvey Margo LITTIE, MD  predniSONE  (DELTASONE ) 10 MG tablet Take 1 dose pk by oral route in the morning, for back pain. Patient not taking: Reported on 08/19/2023 07/31/23   [provider]      Allergies    Patient has no known allergies.     Review of Systems   Review of Systems  Physical Exam Updated Vital Signs BP 116/61 (BP Location: Right Arm)   Pulse 89   Temp 97.8 F (36.6 C)   Resp 19   Ht 5' 7 (1.702 m)   Wt 72.9 kg   SpO2 97%   BMI 25.17 kg/m  Physical Exam Vitals and nursing note reviewed.  Constitutional:      General: He is not in acute distress.    Appearance: He is well-developed.  HENT:     Head: Normocephalic and atraumatic.     Right Ear: External ear normal.     Left Ear: External ear normal.     Nose: Nose normal.  Eyes:     Extraocular Movements: Extraocular movements intact.     Conjunctiva/sclera: Conjunctivae normal.     Pupils: Pupils are equal, round, and reactive to light.  Cardiovascular:     Rate and Rhythm: Normal rate and regular rhythm.     Heart sounds: Normal heart sounds.  Pulmonary:     Effort: Pulmonary effort is normal. No respiratory distress.     Breath sounds: Rhonchi (Right lower lobe) present.     Comments: On 4 L nasal cannula Abdominal:     General: There is no distension.     Palpations: Abdomen is soft. There is no mass.     Tenderness: There is no abdominal tenderness. There is no guarding.  Musculoskeletal:     Cervical back: Normal range of motion and neck supple.     Right lower leg: No edema.     Left lower leg: No edema.  Skin:    General: Skin is warm and dry.  Neurological:     Mental Status: He is alert. Mental status is at baseline.  Psychiatric:        Mood and Affect: Mood normal.        Behavior: Behavior normal.     ED Results / Procedures / Treatments   Labs (all labs ordered are listed, but only abnormal results are displayed) Labs Reviewed  RESP PANEL BY RT-PCR (RSV, FLU A&B, COVID)  RVPGX2 - Abnormal; Notable for the following components:      Result Value   Influenza A by PCR POSITIVE (*)    All other components within normal limits  COMPREHENSIVE METABOLIC PANEL - Abnormal; Notable for the following components:   Sodium  130 (*)    Potassium 3.3 (*)    Chloride 94 (*)    BUN 33 (*)    Creatinine, Ser 1.58 (*)    Calcium 8.4 (*)    Albumin 3.1 (*)    AST 275 (*)    ALT 160 (*)    GFR, Estimated 50 (*)    All other components within normal limits  CBC WITH DIFFERENTIAL/PLATELET - Abnormal; Notable for the following components:   RBC 4.19 (*)    Platelets 148 (*)    Lymphs Abs 0.4 (*)    All other components within normal limits  CULTURE, BLOOD (ROUTINE X 2)  CULTURE, BLOOD (ROUTINE X 2)  LACTIC ACID, PLASMA  LACTIC ACID, PLASMA  HIV ANTIBODY (ROUTINE TESTING W REFLEX)  PROCALCITONIN  MAGNESIUM  BASIC METABOLIC PANEL  CBC    EKG EKG Interpretation Date/Time:  Wednesday August 19 2023 09:55:06 EST Ventricular Rate:  107 PR Interval:  153 QRS Duration:  77 QT Interval:  321 QTC Calculation: 429 R Axis:   97  Text Interpretation: Sinus tachycardia Borderline right axis deviation Confirmed by Yolande Charleston [id number]) on 08/19/2023 10:24:17 AM  Radiology CT Angio Chest Pulmonary Embolism (PE) W or WO Contrast Result Date: 08/19/2023 CLINICAL DATA:  Right sided rales. Fever and cough. Concern for pneumonia. EXAM: CT ANGIOGRAPHY CHEST WITH CONTRAST TECHNIQUE: Multidetector CT imaging of the chest was performed using the standard protocol during bolus administration of intravenous contrast. Multiplanar CT image reconstructions and MIPs were obtained to evaluate the vascular anatomy. RADIATION DOSE REDUCTION: This exam was performed according to the departmental dose-optimization program which includes automated exposure control, adjustment of the mA and/or kV according to patient size and/or use of iterative reconstruction technique. CONTRAST:  75mL OMNIPAQUE  IOHEXOL  350 MG/ML SOLN COMPARISON:  Chest radiograph dated 08/19/2023. FINDINGS: Cardiovascular: There is no cardiomegaly. Trace pericardial effusion anterior to the heart. There is mild atherosclerotic calcification of the thoracic aorta. Note is  made dilatation or dissection. No pulmonary artery embolus identified. Mediastinum/Nodes: Mildly enlarged bilateral hilar lymph nodes measure up to 10 mm in short axis. The esophagus and the thyroid gland are grossly unremarkable. No mediastinal fluid collection. Lungs/Pleura: Bilateral ground-glass micronodularity with lower lobe predominant consistent with multifocal pneumonia, possibly atypical in etiology. Aspiration is not excluded. No consolidative changes. There is no pleural effusion or pneumothorax. There is peribronchial thickening. Mucus secretions noted in the bilateral mainstem bronchi. The central airways remain patent. Upper Abdomen: No acute findings. A 2 cm right adrenal nodule most consistent with  an adenoma. Musculoskeletal: No acute osseous pathology. Review of the MIP images confirms the above findings. IMPRESSION: 1. No CT evidence of pulmonary artery embolus. 2. Multifocal pneumonia, possibly atypical in etiology. 3.  Aortic Atherosclerosis (ICD10-I70.0). Electronically Signed   By: Vanetta Chou M.D.   On: 08/19/2023 12:34   DG Chest 2 View Result Date: 08/19/2023 CLINICAL DATA:  Possible sepsis. Cough, congestion and dizziness with weakness for the past 4 days. Hypotension. Recent ex-smoker. EXAM: CHEST - 2 VIEW COMPARISON:  09/29/2022 FINDINGS: Normal sized heart. Stable hyperexpansion of the lungs and mild peribronchial thickening. No airspace consolidation or pleural fluid minimal thoracic spine degenerative changes. Cervical spine fixation hardware IMPRESSION: 1. No acute abnormality. 2. Stable changes of COPD and chronic bronchitis Electronically Signed   By: Elspeth Bathe M.D.   On: 08/19/2023 11:36    Procedures Procedures    Medications Ordered in ED Medications  oseltamivir  (TAMIFLU ) capsule 30 mg (30 mg Oral Given 08/19/23 1202)  clonazePAM  (KLONOPIN ) tablet 2 mg (has no administration in time range)  ipratropium-albuterol  (DUONEB) 0.5-2.5 (3) MG/3ML nebulizer solution  3 mL (has no administration in time range)  dextromethorphan -guaiFENesin  (MUCINEX  DM) 30-600 MG per 12 hr tablet 1 tablet (1 tablet Oral Given 08/19/23 1439)  budesonide  (PULMICORT ) nebulizer solution 0.25 mg (0.25 mg Nebulization Not Given 08/19/23 1513)  methylPREDNISolone  sodium succinate (SOLU-MEDROL ) 40 mg/mL injection 40 mg (has no administration in time range)  pantoprazole  (PROTONIX ) EC tablet 40 mg (40 mg Oral Given 08/19/23 1439)  acetaminophen  (TYLENOL ) tablet 650 mg (has no administration in time range)    Or  acetaminophen  (TYLENOL ) suppository 650 mg (has no administration in time range)  ondansetron  (ZOFRAN ) tablet 4 mg (has no administration in time range)    Or  ondansetron  (ZOFRAN ) injection 4 mg (has no administration in time range)  LORazepam  (ATIVAN ) tablet 1-4 mg (has no administration in time range)    Or  LORazepam  (ATIVAN ) injection 1-4 mg (has no administration in time range)  thiamine  (VITAMIN B1) tablet 100 mg (100 mg Oral Given 08/19/23 1438)    Or  thiamine  (VITAMIN B1) injection 100 mg ( Intravenous See Alternative 08/19/23 1438)  folic acid  (FOLVITE ) tablet 1 mg (1 mg Oral Given 08/19/23 1438)  multivitamin with minerals tablet 1 tablet (1 tablet Oral Given 08/19/23 1438)  0.9 %  sodium chloride  infusion ( Intravenous New Bag/Given 08/19/23 1857)  ezetimibe  (ZETIA ) tablet 10 mg (10 mg Oral Not Given 08/19/23 1705)    And  simvastatin  (ZOCOR ) tablet 40 mg (40 mg Oral Not Given 08/19/23 1706)  lactated ringers  bolus 1,000 mL (0 mLs Intravenous Stopped 08/19/23 1128)    And  lactated ringers  bolus 1,000 mL (0 mLs Intravenous Stopped 08/19/23 1234)  cefTRIAXone  (ROCEPHIN ) 2 g in sodium chloride  0.9 % 100 mL IVPB (0 g Intravenous Stopped 08/19/23 1057)  azithromycin  (ZITHROMAX ) 500 mg in sodium chloride  0.9 % 250 mL IVPB (0 mg Intravenous Stopped 08/19/23 1234)  iohexol  (OMNIPAQUE ) 350 MG/ML injection 75 mL (75 mLs Intravenous Contrast Given 08/19/23 1105)  potassium chloride  SA (KLOR-CON   M) CR tablet 40 mEq (40 mEq Oral Given 08/19/23 1439)    ED Course/ Medical Decision Making/ A&P Clinical Course as of 08/19/23 1943  Wed Aug 19, 2023  1046 Creatinine(!): 1.58 Baseline of 0.9 [RP]  1239 CT Angio Chest Pulmonary Embolism (PE) W or WO Contrast IMPRESSION: 1. No CT evidence of pulmonary artery embolus. 2. Multifocal pneumonia, possibly atypical in etiology. 3. Aortic Atherosclerosis (ICD10-I70.0).   [  RP]  1254 Dr Maree to admit [RP]    Clinical Course User Index [RP] Yolande Lamar BROCKS, MD                                 Medical Decision Making Amount and/or Complexity of Data Reviewed Labs: ordered. Decision-making details documented in ED Course. Radiology: ordered. Decision-making details documented in ED Course.  Risk Prescription drug management. Decision regarding hospitalization.   Scott Buck is a 58 y.o. male with comorbidities that complicate the patient evaluation including tobacco use who presents to the emergency department with fevers, chills, fatigue, cough, nausea vomiting and diarrhea.   Initial Ddx:  Pneumonia, URI, COPD/reactive airway disease, PE  MDM/Course:  Patient presents emergency department with URI symptoms as well as nausea vomiting diarrhea.  Initially was concerned about pneumonia versus a viral illness.  Chest x-ray was negative for infiltrate but with his abnormal lung sounds and hypotension initially was concerning for sepsis from pneumonia.  Code sepsis was activated he was given a 30 mL/kg bolus and started on ceftriaxone  and azithromycin .  He was ultimately found to have influenza.  Started on Tamiflu .  Did have a CT scan to rule out an occult pneumonia which did show multifocal pneumonia but I suspect this is likely due to influenza rather than bacterial pneumonia.  Did not show any evidence of PE.  Upon re-evaluation was stable on 3 L and was admitted to hospitalist.  This patient presents to the ED for concern of complaints  listed in HPI, this involves an extensive number of treatment options, and is a complaint that carries with it a high risk of complications and morbidity. Disposition including potential need for admission considered.   Dispo: Admit to Floor  Additional history obtained from spouse Records reviewed Outpatient Clinic Notes The following labs were independently interpreted: Chemistry and show no acute abnormality I independently reviewed the following imaging with scope of interpretation limited to determining acute life threatening conditions related to emergency care: Chest x-ray and agree with the radiologist interpretation with the following exceptions: none I personally reviewed and interpreted cardiac monitoring: normal sinus rhythm  I personally reviewed and interpreted the pt's EKG: see above for interpretation  I have reviewed the patients home medications and made adjustments as needed Consults: Hospitalist  Portions of this note were generated with Scientist, clinical (histocompatibility and immunogenetics). Dictation errors may occur despite best attempts at proofreading.     Final Clinical Impression(s) / ED Diagnoses Final diagnoses:  Dehydration  AKI (acute kidney injury) (HCC)  Hypotension, unspecified hypotension type  Influenza A  Hypoxia    Rx / DC Orders ED Discharge Orders     None         Yolande Lamar BROCKS, MD 08/19/23 1944

## 2023-08-19 NOTE — ED Notes (Signed)
Attempted to call report at this time. Unsuccessful.  

## 2023-08-19 NOTE — ED Notes (Signed)
 ED Provider at bedside.

## 2023-08-19 NOTE — H&P (Signed)
 History and Physical    DUONG HAYDEL FMW:[id number] DOB: 03-05-1966 DOA: 08/19/2023  PCP: Bertell Satterfield, MD   Patient coming from: Home  Chief Complaint: Cough and wheeze  HPI: Scott Buck is a 58 y.o. male with medical history significant for hypertension, tobacco abuse, alcohol abuse, and anxiety disorder who initially presented to urgent care with complaints of coughing and wheezing over the last 4 days.  He also had some initial fevers and chills as well as nausea, vomiting, and diarrhea with poor oral intake over that same timeframe.  He was noted to be hypoxemic and then transferred to the ED for further evaluation.  He denies any sick contacts.  He states that he quit smoking 5 days ago, but typically smokes 1/2 pack/day and drinks 3 beers on a daily basis.   ED Course: Vital signs with soft blood pressure readings noted he was given 2 L fluid bolus with improvement.  He is otherwise afebrile.  Sodium of 130 and creatinine 1.58 with lactic acid 1.7.  Chest x-ray with no acute abnormalities aside from some findings of COPD.  CT chest with no findings of PE and does seem to demonstrate some multifocal pneumonia.  Review of Systems: Reviewed as noted above, otherwise negative.  Past Medical History:  Diagnosis Date   Anxiety    Hypercholesteremia    Hypertension     Past Surgical History:  Procedure Laterality Date   COLONOSCOPY N/A 04/27/2014   Dr. Harvey: 2 polyps removed, one was a simple adenoma.  Next colonoscopy 5 years.   KNEE SURGERY Right 1990   To repair right patella   NECK SURGERY  05/15/2021     reports that he quit smoking 4 days ago. His smoking use included cigarettes. He has a 26.3 pack-year smoking history. He has never used smokeless tobacco. He reports current alcohol use. He reports that he does not use drugs.  No Known Allergies  Family History  Problem Relation Age of Onset   Cancer - Other Mother    Cancer - Lung Father    Emphysema Father     Pulmonary fibrosis Father    Colon polyps Father    Hypertension Sister    Hypercholesterolemia Sister    Colon polyps Sister    Hypertension Brother    Hypercholesterolemia Brother    Hypertension Brother    Hypercholesterolemia Brother    Colon cancer Neg Hx     Prior to Admission medications   Medication Sig Start Date End Date Taking? Authorizing Provider  acetaminophen  (TYLENOL ) 500 MG tablet Take 1,000 mg by mouth as needed.     [provider]  clonazePAM  (KLONOPIN ) 2 MG tablet Take 2 mg by mouth daily.    [provider]  ezetimibe -simvastatin  (VYTORIN ) 10-40 MG tablet Take 1 tablet by mouth daily. 03/19/19   [provider]  losartan (COZAAR) 50 MG tablet Take 50 mg by mouth daily.    [provider]  meclizine  (ANTIVERT ) 12.5 MG tablet Take 1 tablet (12.5 mg total) by mouth 3 (three) times daily as needed for dizziness. Do not take with alcohol or while driving or operating heavy machinery Patient not taking: Reported on 08/19/2023 03/25/22   Chandra Harlene LABOR, NP  polyethylene glycol-electrolytes (NULYTELY/GOLYTELY) 420 g solution As directed Patient not taking: Reported on 08/19/2023 06/16/19   Harvey Margo LITTIE, MD    Physical Exam: Vitals:   08/19/23 1055 08/19/23 1200 08/19/23 1233 08/19/23 1300  BP: 91/60 103/61  107/74  Pulse: 90 90 87 87  Resp: 17 (!) 21 18 15   Temp:      TempSrc:      SpO2: 96% 95% 94% 94%    Constitutional: NAD, calm, comfortable Vitals:   08/19/23 1055 08/19/23 1200 08/19/23 1233 08/19/23 1300  BP: 91/60 103/61  107/74  Pulse: 90 90 87 87  Resp: 17 (!) 21 18 15   Temp:      TempSrc:      SpO2: 96% 95% 94% 94%   Eyes: lids and conjunctivae normal Neck: normal, supple Respiratory: clear to auscultation bilaterally. Normal respiratory effort. No accessory muscle use.  3 L nasal cannula Cardiovascular: Regular rate and rhythm, no murmurs. Abdomen: no tenderness, no distention. Bowel sounds positive.   Musculoskeletal:  No edema. Skin: no rashes, lesions, ulcers.  Psychiatric: Flat affect  Labs on Admission: I have personally reviewed following labs and imaging studies  CBC: Recent Labs  Lab 08/19/23 1015  WBC 5.5  NEUTROABS 4.5  HGB 13.9  HCT 39.8  MCV 95.0  PLT 148*   Basic Metabolic Panel: Recent Labs  Lab 08/19/23 1015  NA 130*  K 3.3*  CL 94*  CO2 22  GLUCOSE 97  BUN 33*  CREATININE 1.58*  CALCIUM 8.4*   GFR: CrCl cannot be calculated (Unknown ideal weight.). Liver Function Tests: Recent Labs  Lab 08/19/23 1015  AST 275*  ALT 160*  ALKPHOS 40  BILITOT 1.1  PROT 6.5  ALBUMIN 3.1*   No results for input(s): LIPASE, AMYLASE in the last 168 hours. No results for input(s): AMMONIA in the last 168 hours. Coagulation Profile: No results for input(s): INR, PROTIME in the last 168 hours. Cardiac Enzymes: No results for input(s): CKTOTAL, CKMB, CKMBINDEX, TROPONINI in the last 168 hours. BNP (last 3 results) No results for input(s): PROBNP in the last 8760 hours. HbA1C: No results for input(s): HGBA1C in the last 72 hours. CBG: No results for input(s): GLUCAP in the last 168 hours. Lipid Profile: No results for input(s): CHOL, HDL, LDLCALC, TRIG, CHOLHDL, LDLDIRECT in the last 72 hours. Thyroid Function Tests: No results for input(s): TSH, T4TOTAL, FREET4, T3FREE, THYROIDAB in the last 72 hours. Anemia Panel: No results for input(s): VITAMINB12, FOLATE, FERRITIN, TIBC, IRON, RETICCTPCT in the last 72 hours. Urine analysis: No results found for: COLORURINE, APPEARANCEUR, LABSPEC, PHURINE, GLUCOSEU, HGBUR, BILIRUBINUR, KETONESUR, PROTEINUR, UROBILINOGEN, NITRITE, LEUKOCYTESUR  Radiological Exams on Admission: CT Angio Chest Pulmonary Embolism (PE) W or WO Contrast Result Date: 08/19/2023 CLINICAL DATA:  Right sided rales. Fever and cough. Concern for pneumonia. EXAM: CT  ANGIOGRAPHY CHEST WITH CONTRAST TECHNIQUE: Multidetector CT imaging of the chest was performed using the standard protocol during bolus administration of intravenous contrast. Multiplanar CT image reconstructions and MIPs were obtained to evaluate the vascular anatomy. RADIATION DOSE REDUCTION: This exam was performed according to the departmental dose-optimization program which includes automated exposure control, adjustment of the mA and/or kV according to patient size and/or use of iterative reconstruction technique. CONTRAST:  75mL OMNIPAQUE  IOHEXOL  350 MG/ML SOLN COMPARISON:  Chest radiograph dated 08/19/2023. FINDINGS: Cardiovascular: There is no cardiomegaly. Trace pericardial effusion anterior to the heart. There is mild atherosclerotic calcification of the thoracic aorta. Note is made dilatation or dissection. No pulmonary artery embolus identified. Mediastinum/Nodes: Mildly enlarged bilateral hilar lymph nodes measure up to 10 mm in short axis. The esophagus and the thyroid gland are grossly unremarkable. No mediastinal fluid collection. Lungs/Pleura: Bilateral ground-glass micronodularity with lower lobe predominant consistent with multifocal pneumonia, possibly  atypical in etiology. Aspiration is not excluded. No consolidative changes. There is no pleural effusion or pneumothorax. There is peribronchial thickening. Mucus secretions noted in the bilateral mainstem bronchi. The central airways remain patent. Upper Abdomen: No acute findings. A 2 cm right adrenal nodule most consistent with an adenoma. Musculoskeletal: No acute osseous pathology. Review of the MIP images confirms the above findings. IMPRESSION: 1. No CT evidence of pulmonary artery embolus. 2. Multifocal pneumonia, possibly atypical in etiology. 3.  Aortic Atherosclerosis (ICD10-I70.0). Electronically Signed   By: Vanetta Chou M.D.   On: 08/19/2023 12:34   DG Chest 2 View Result Date: 08/19/2023 CLINICAL DATA:  Possible sepsis. Cough,  congestion and dizziness with weakness for the past 4 days. Hypotension. Recent ex-smoker. EXAM: CHEST - 2 VIEW COMPARISON:  09/29/2022 FINDINGS: Normal sized heart. Stable hyperexpansion of the lungs and mild peribronchial thickening. No airspace consolidation or pleural fluid minimal thoracic spine degenerative changes. Cervical spine fixation hardware IMPRESSION: 1. No acute abnormality. 2. Stable changes of COPD and chronic bronchitis Electronically Signed   By: Elspeth Bathe M.D.   On: 08/19/2023 11:36    EKG: Independently reviewed. ST 107 bpm.  Assessment/Plan Principal Problem:   Acute hypoxemic respiratory failure (HCC)    Acute hypoxemic respiratory failure secondary to COPD exacerbation in the setting of influenza A pneumonia -Treat with Tamiflu , droplet precautions -IV Solu-Medrol  twice daily -DuoNebs as needed for shortness of breath or wheezing -Antitussives -Wean to room air -Check procalcitonin  AKI versus progressive CKD -Likely prerenal in the setting of nausea, vomiting, diarrhea -Given 2 L fluid bolus in ED -Continue gentle IV fluid -Prior creatinine 0.91-year ago  Mild hyponatremia/hypokalemia -Continue normal saline -Replete and continue to monitor  Hypertension -Currently with hypotension, hold home medications -Improved with IV fluid -Monitor closely  Tobacco abuse -Counseled on cessation, plans to quit  Alcohol abuse -Drinks 3 beers daily -CIWA protocol  Anxiety disorder -Continue Klonopin    DVT prophylaxis: SCDs Code Status: Full Family Communication: None at bedside Disposition Plan: Admit for influenza treatment Consults called: None Admission status: Inpatient, telemetry  Severity of Illness: The appropriate patient status for this patient is INPATIENT. Inpatient status is judged to be reasonable and necessary in order to provide the required intensity of service to ensure the patient's safety. The patient's presenting symptoms, physical  exam findings, and initial radiographic and laboratory data in the context of their chronic comorbidities is felt to place them at high risk for further clinical deterioration. Furthermore, it is not anticipated that the patient will be medically stable for discharge from the hospital within 2 midnights of admission.   * I certify that at the point of admission it is my clinical judgment that the patient will require inpatient hospital care spanning beyond 2 midnights from the point of admission due to high intensity of service, high risk for further deterioration and high frequency of surveillance required.*   Kahla Risdon D Marios Gaiser DO [facility]  If 7PM-7AM, please contact night-coverage [email or web address]  08/19/2023, 1:13 PM

## 2023-08-20 DIAGNOSIS — J9601 Acute respiratory failure with hypoxia: Secondary | ICD-10-CM | POA: Diagnosis not present

## 2023-08-20 LAB — CBC
HCT: 35.5 % — ABNORMAL LOW (ref 39.0–52.0)
Hemoglobin: 11.6 g/dL — ABNORMAL LOW (ref 13.0–17.0)
MCH: 31.7 pg (ref 26.0–34.0)
MCHC: 32.7 g/dL (ref 30.0–36.0)
MCV: 97 fL (ref 80.0–100.0)
Platelets: 145 10*3/uL — ABNORMAL LOW (ref 150–400)
RBC: 3.66 MIL/uL — ABNORMAL LOW (ref 4.22–5.81)
RDW: 12.5 % (ref 11.5–15.5)
WBC: 6.2 10*3/uL (ref 4.0–10.5)
nRBC: 0 % (ref 0.0–0.2)

## 2023-08-20 LAB — RESPIRATORY PANEL BY PCR

## 2023-08-20 LAB — IRON AND TIBC
Iron: 33 ug/dL — ABNORMAL LOW (ref 45–182)
Saturation Ratios: 14 % — ABNORMAL LOW (ref 17.9–39.5)
TIBC: 242 ug/dL — ABNORMAL LOW (ref 250–450)
UIBC: 209 ug/dL

## 2023-08-20 LAB — RETICULOCYTES
Immature Retic Fract: 12.6 % (ref 2.3–15.9)
RBC.: 3.57 MIL/uL — ABNORMAL LOW (ref 4.22–5.81)
Retic Count, Absolute: 44.6 10*3/uL (ref 19.0–186.0)
Retic Ct Pct: 1.3 % (ref 0.4–3.1)

## 2023-08-20 LAB — VITAMIN B12: Vitamin B-12: 970 pg/mL — ABNORMAL HIGH (ref 180–914)

## 2023-08-20 LAB — BASIC METABOLIC PANEL
Anion gap: 7 (ref 5–15)
BUN: 19 mg/dL (ref 6–20)
CO2: 26 mmol/L (ref 22–32)
Calcium: 8.2 mg/dL — ABNORMAL LOW (ref 8.9–10.3)
Chloride: 103 mmol/L (ref 98–111)
Creatinine, Ser: 0.64 mg/dL (ref 0.61–1.24)
GFR, Estimated: >60 mL/min (ref >60)
Glucose, Bld: 153 mg/dL — ABNORMAL HIGH (ref 70–99)
Potassium: 4.4 mmol/L (ref 3.5–5.1)
Sodium: 136 mmol/L (ref 135–145)

## 2023-08-20 LAB — FOLATE: Folate: 9.6 ng/mL (ref >5.9)

## 2023-08-20 LAB — FERRITIN: Ferritin: 2015 ng/mL — ABNORMAL HIGH (ref 24–336)

## 2023-08-20 LAB — MAGNESIUM: Magnesium: 2.3 mg/dL (ref 1.7–2.4)

## 2023-08-20 LAB — PROCALCITONIN: Procalcitonin: 0.43 ng/mL

## 2023-08-20 MED ORDER — OSELTAMIVIR PHOSPHATE 30 MG PO CAPS: 30.0000 mg | ORAL_CAPSULE | Freq: Two times a day (BID) | ORAL | 0 refills | Status: AC

## 2023-08-20 MED ORDER — PREDNISONE 10 MG PO TABS: 40.0000 mg | ORAL_TABLET | Freq: Every day | ORAL | 0 refills | Status: AC

## 2023-08-20 MED ORDER — DM-GUAIFENESIN ER 30-600 MG PO TB12: 1.0000 | ORAL_TABLET | Freq: Two times a day (BID) | ORAL | 0 refills | Status: DC | PRN

## 2023-08-20 MED ORDER — ALBUTEROL SULFATE HFA 108 (90 BASE) MCG/ACT IN AERS: 2.0000 | INHALATION_SPRAY | Freq: Four times a day (QID) | RESPIRATORY_TRACT | 2 refills | Status: DC | PRN

## 2023-08-20 NOTE — Progress Notes (Signed)
 Patient discharged home with instructions given on medications and follow up visits,patient verbalized understanding. Prescriptions sent to Pharmacy of choice documented on AVS.IV discontinued,catheter intact. Staff to accompany patient to awaiting vehicle.

## 2023-08-20 NOTE — Progress Notes (Signed)
 Patient placed on Droplet precautions per Infection control policy,and Dr Carlis Cherry orders.Patient educated on Droplet precaution verbalized undredstanding,educations carenotes provided.Plan of care on going.

## 2023-08-20 NOTE — Discharge Summary (Signed)
 Physician Discharge Summary  DRU PRIMEAU FMW:[id number] DOB: 1966/07/13 DOA: 08/19/2023  PCP: Bertell Satterfield, MD  Admit date: 08/19/2023  Discharge date: 08/20/2023  Admitted From:Home  Disposition:  Home  Recommendations for Outpatient Follow-up:  Follow up with PCP in 1-2 weeks Continue Tamiflu  to complete course of treatment as prescribed Continue prednisone  as prescribed for 5 days Use inhaler as needed for shortness of breath or wheezing Antitussives prescribed Continue other home medications Counseled on tobacco cessation  Home Health: None  Equipment/Devices: None  Discharge Condition:Stable  CODE STATUS: Full  Diet recommendation: Heart Healthy  Brief/Interim Summary: Scott Buck is a 58 y.o. male with medical history significant for hypertension, tobacco abuse, alcohol abuse, and anxiety disorder who initially presented to urgent care with complaints of coughing and wheezing over the last 4 days.  He also had some initial fevers and chills as well as nausea, vomiting, and diarrhea with poor oral intake over that same timeframe.  He was admitted for acute hypoxemic respiratory failure secondary to acute COPD exacerbation in the setting of influenza a pneumonia.  He was also noted to have prerenal AKI due to GI losses with nausea, vomiting, and diarrhea.  This has improved and he is back to baseline and not requiring oxygen any longer.  He will remain on Tamiflu  and prednisone  as prescribed to complete the course of treatment.  No other acute events or concerns noted.  Discharge Diagnoses:  Principal Problem:   Acute hypoxemic respiratory failure (HCC)  Principal discharge diagnosis: Acute hypoxemic respiratory failure secondary to COPD exacerbation in the setting of influenza A.  Prerenal AKI.  Discharge Instructions  Discharge Instructions     Diet - low sodium heart healthy   Complete by: As directed    Increase activity slowly   Complete by: As directed        Allergies as of 08/20/2023   No Known Allergies      Medication List     TAKE these medications    acetaminophen  500 MG tablet Commonly known as: TYLENOL  Take 1,000 mg by mouth as needed.   albuterol  108 (90 Base) MCG/ACT inhaler Commonly known as: VENTOLIN  HFA Inhale 2 puffs into the lungs every 6 (six) hours as needed for wheezing or shortness of breath.   clonazePAM  2 MG tablet Commonly known as: KLONOPIN  Take 2 mg by mouth daily.   dextromethorphan -guaiFENesin  30-600 MG 12hr tablet Commonly known as: MUCINEX  DM Take 1 tablet by mouth 2 (two) times daily as needed for cough.   ezetimibe -simvastatin  10-40 MG tablet Commonly known as: VYTORIN  Take 1 tablet by mouth daily.   ibuprofen 200 MG tablet Commonly known as: ADVIL Take 400 mg by mouth every 6 (six) hours as needed for moderate pain (pain score 4-6).   losartan 50 MG tablet Commonly known as: COZAAR Take 50 mg by mouth daily.   oseltamivir  30 MG capsule Commonly known as: TAMIFLU  Take 1 capsule (30 mg total) by mouth 2 (two) times daily for 3 days.   polyethylene glycol-electrolytes 420 g solution Commonly known as: NuLYTELY As directed   predniSONE  10 MG tablet Commonly known as: DELTASONE  Take 4 tablets (40 mg total) by mouth daily for 5 days. What changed: See the new instructions.        Follow-up Information     Bertell Satterfield, MD. Schedule an appointment as soon as possible for a visit in 1 week(s).   Specialty: Internal Medicine Contact information: [address] [place] [address] [phone]  No Known Allergies  Consultations: None   Procedures/Studies: CT Angio Chest Pulmonary Embolism (PE) W or WO Contrast Result Date: 08/19/2023 CLINICAL DATA:  Right sided rales. Fever and cough. Concern for pneumonia. EXAM: CT ANGIOGRAPHY CHEST WITH CONTRAST TECHNIQUE: Multidetector CT imaging of the chest was performed using the standard protocol during  bolus administration of intravenous contrast. Multiplanar CT image reconstructions and MIPs were obtained to evaluate the vascular anatomy. RADIATION DOSE REDUCTION: This exam was performed according to the departmental dose-optimization program which includes automated exposure control, adjustment of the mA and/or kV according to patient size and/or use of iterative reconstruction technique. CONTRAST:  75mL OMNIPAQUE  IOHEXOL  350 MG/ML SOLN COMPARISON:  Chest radiograph dated 08/19/2023. FINDINGS: Cardiovascular: There is no cardiomegaly. Trace pericardial effusion anterior to the heart. There is mild atherosclerotic calcification of the thoracic aorta. Note is made dilatation or dissection. No pulmonary artery embolus identified. Mediastinum/Nodes: Mildly enlarged bilateral hilar lymph nodes measure up to 10 mm in short axis. The esophagus and the thyroid gland are grossly unremarkable. No mediastinal fluid collection. Lungs/Pleura: Bilateral ground-glass micronodularity with lower lobe predominant consistent with multifocal pneumonia, possibly atypical in etiology. Aspiration is not excluded. No consolidative changes. There is no pleural effusion or pneumothorax. There is peribronchial thickening. Mucus secretions noted in the bilateral mainstem bronchi. The central airways remain patent. Upper Abdomen: No acute findings. A 2 cm right adrenal nodule most consistent with an adenoma. Musculoskeletal: No acute osseous pathology. Review of the MIP images confirms the above findings. IMPRESSION: 1. No CT evidence of pulmonary artery embolus. 2. Multifocal pneumonia, possibly atypical in etiology. 3.  Aortic Atherosclerosis (ICD10-I70.0). Electronically Signed   By: Vanetta Chou M.D.   On: 08/19/2023 12:34   DG Chest 2 View Result Date: 08/19/2023 CLINICAL DATA:  Possible sepsis. Cough, congestion and dizziness with weakness for the past 4 days. Hypotension. Recent ex-smoker. EXAM: CHEST - 2 VIEW COMPARISON:   09/29/2022 FINDINGS: Normal sized heart. Stable hyperexpansion of the lungs and mild peribronchial thickening. No airspace consolidation or pleural fluid minimal thoracic spine degenerative changes. Cervical spine fixation hardware IMPRESSION: 1. No acute abnormality. 2. Stable changes of COPD and chronic bronchitis Electronically Signed   By: Elspeth Bathe M.D.   On: 08/19/2023 11:36     Discharge Exam: Vitals:   08/20/23 0941 08/20/23 1017  BP: 124/80 133/79  Pulse: 96 88  Resp:    Temp: 97.6 F (36.4 C) 97.6 F (36.4 C)  SpO2: 92%    Vitals:   08/20/23 0537 08/20/23 0816 08/20/23 0941 08/20/23 1017  BP: 115/72  124/80 133/79  Pulse: 83  96 88  Resp: 16     Temp: 98 F (36.7 C)  97.6 F (36.4 C) 97.6 F (36.4 C)  TempSrc:   Oral Oral  SpO2: 93% 97% 92%   Weight:      Height:        General: Pt is alert, awake, not in acute distress Cardiovascular: RRR, S1/S2 +, no rubs, no gallops Respiratory: CTA bilaterally, no wheezing, no rhonchi Abdominal: Soft, NT, ND, bowel sounds + Extremities: no edema, no cyanosis    The results of significant diagnostics from this hospitalization (including imaging, microbiology, ancillary and laboratory) are listed below for reference.     Microbiology: Recent Results (from the past 240 hours)  Resp panel by RT-PCR (RSV, Flu A&B, Covid) Anterior Nasal Swab     Status: Abnormal   Collection Time: 08/19/23 10:08 AM   Specimen: Anterior Nasal Swab  Result Value Ref Range Status   SARS Coronavirus 2 by RT PCR NEGATIVE NEGATIVE Final    Comment: (NOTE) SARS-CoV-2 target nucleic acids are NOT DETECTED.  The SARS-CoV-2 RNA is generally detectable in upper respiratory specimens during the acute phase of infection. The lowest concentration of SARS-CoV-2 viral copies this assay can detect is 138 copies/mL. A negative result does not preclude SARS-Cov-2 infection and should not be used as the sole basis for treatment or other patient  management decisions. A negative result may occur with  improper specimen collection/handling, submission of specimen other than nasopharyngeal swab, presence of viral mutation(s) within the areas targeted by this assay, and inadequate number of viral copies(<138 copies/mL). A negative result must be combined with clinical observations, patient history, and epidemiological information. The expected result is Negative.  Fact Sheet for Patients:  bloggercourse.com  Fact Sheet for Healthcare Providers:  seriousbroker.it  This test is no t yet approved or cleared by the [place]  FDA and  has been authorized for detection and/or diagnosis of SARS-CoV-2 by FDA under an Emergency Use Authorization (EUA). This EUA will remain  in effect (meaning this test can be used) for the duration of the COVID-19 declaration under Section 564(b)(1) of the Act, 21 U.S.C.section 360bbb-3(b)(1), unless the authorization is terminated  or revoked sooner.       Influenza A by PCR POSITIVE (A) NEGATIVE Final   Influenza B by PCR NEGATIVE NEGATIVE Final    Comment: (NOTE) The Xpert Xpress SARS-CoV-2/FLU/RSV plus assay is intended as an aid in the diagnosis of influenza from Nasopharyngeal swab specimens and should not be used as a sole basis for treatment. Nasal washings and aspirates are unacceptable for Xpert Xpress SARS-CoV-2/FLU/RSV testing.  Fact Sheet for Patients: bloggercourse.com  Fact Sheet for Healthcare Providers: seriousbroker.it  This test is not yet approved or cleared by the [place]  FDA and has been authorized for detection and/or diagnosis of SARS-CoV-2 by FDA under an Emergency Use Authorization (EUA). This EUA will remain in effect (meaning this test can be used) for the duration of the COVID-19 declaration under Section 564(b)(1) of the Act, 21 U.S.C. section  360bbb-3(b)(1), unless the authorization is terminated or revoked.     Resp Syncytial Virus by PCR NEGATIVE NEGATIVE Final    Comment: (NOTE) Fact Sheet for Patients: bloggercourse.com  Fact Sheet for Healthcare Providers: seriousbroker.it  This test is not yet approved or cleared by the [place]  FDA and has been authorized for detection and/or diagnosis of SARS-CoV-2 by FDA under an Emergency Use Authorization (EUA). This EUA will remain in effect (meaning this test can be used) for the duration of the COVID-19 declaration under Section 564(b)(1) of the Act, 21 U.S.C. section 360bbb-3(b)(1), unless the authorization is terminated or revoked.  Performed at [facility], [address], [place], [place] [address]   Blood Culture (routine x 2)     Status: None (Preliminary result)   Collection Time: 08/19/23 10:15 AM   Specimen: BLOOD  Result Value Ref Range Status   Specimen Description BLOOD BLOOD RIGHT ARM forearm  Final   Special Requests   Final    BOTTLES DRAWN AEROBIC AND ANAEROBIC Blood Culture results may not be optimal due to an inadequate volume of blood received in culture bottles   Culture   Final    NO GROWTH < 24 HOURS Performed at [facility], [address] [place], [place] [address]    Report Status PENDING  Incomplete  Blood Culture (routine x  2)     Status: None (Preliminary result)   Collection Time: 08/19/23 10:54 AM   Specimen: BLOOD  Result Value Ref Range Status   Specimen Description BLOOD BLOOD RIGHT ARM ac  Final   Special Requests   Final    BOTTLES DRAWN AEROBIC AND ANAEROBIC Blood Culture adequate volume   Culture   Final    NO GROWTH < 24 HOURS Performed at [facility], [address] [place], [place] [address]    Report Status PENDING  Incomplete     Labs: BNP (last 3 results) No results for input(s): BNP in the last 8760 hours. Basic Metabolic Panel: Recent Labs  Lab  08/19/23 1015 08/20/23 0354  NA 130* 136  K 3.3* 4.4  CL 94* 103  CO2 22 26  GLUCOSE 97 153*  BUN 33* 19  CREATININE 1.58* 0.64  CALCIUM 8.4* 8.2*  MG  --  2.3   Liver Function Tests: Recent Labs  Lab 08/19/23 1015  AST 275*  ALT 160*  ALKPHOS 40  BILITOT 1.1  PROT 6.5  ALBUMIN 3.1*   No results for input(s): LIPASE, AMYLASE in the last 168 hours. No results for input(s): AMMONIA in the last 168 hours. CBC: Recent Labs  Lab 08/19/23 1015 08/20/23 0354  WBC 5.5 6.2  NEUTROABS 4.5  --   HGB 13.9 11.6*  HCT 39.8 35.5*  MCV 95.0 97.0  PLT 148* 145*   Cardiac Enzymes: No results for input(s): CKTOTAL, CKMB, CKMBINDEX, TROPONINI in the last 168 hours. BNP: Invalid input(s): POCBNP CBG: No results for input(s): GLUCAP in the last 168 hours. D-Dimer No results for input(s): DDIMER in the last 72 hours. Hgb A1c No results for input(s): HGBA1C in the last 72 hours. Lipid Profile No results for input(s): CHOL, HDL, LDLCALC, TRIG, CHOLHDL, LDLDIRECT in the last 72 hours. Thyroid function studies No results for input(s): TSH, T4TOTAL, T3FREE, THYROIDAB in the last 72 hours.  Invalid input(s): FREET3 Anemia work up Recent Labs    08/20/23 0354  VITAMINB12 970*  FOLATE 9.6  FERRITIN 2,015*  TIBC 242*  IRON 33*  RETICCTPCT 1.3   Urinalysis No results found for: COLORURINE, APPEARANCEUR, LABSPEC, PHURINE, GLUCOSEU, HGBUR, BILIRUBINUR, KETONESUR, PROTEINUR, UROBILINOGEN, NITRITE, LEUKOCYTESUR Sepsis Labs Recent Labs  Lab 08/19/23 1015 08/20/23 0354  WBC 5.5 6.2   Microbiology Recent Results (from the past 240 hours)  Resp panel by RT-PCR (RSV, Flu A&B, Covid) Anterior Nasal Swab     Status: Abnormal   Collection Time: 08/19/23 10:08 AM   Specimen: Anterior Nasal Swab  Result Value Ref Range Status   SARS Coronavirus 2 by RT PCR NEGATIVE NEGATIVE Final    Comment: (NOTE) SARS-CoV-2  target nucleic acids are NOT DETECTED.  The SARS-CoV-2 RNA is generally detectable in upper respiratory specimens during the acute phase of infection. The lowest concentration of SARS-CoV-2 viral copies this assay can detect is 138 copies/mL. A negative result does not preclude SARS-Cov-2 infection and should not be used as the sole basis for treatment or other patient management decisions. A negative result may occur with  improper specimen collection/handling, submission of specimen other than nasopharyngeal swab, presence of viral mutation(s) within the areas targeted by this assay, and inadequate number of viral copies(<138 copies/mL). A negative result must be combined with clinical observations, patient history, and epidemiological information. The expected result is Negative.  Fact Sheet for Patients:  bloggercourse.com  Fact Sheet for Healthcare Providers:  seriousbroker.it  This test is no t yet  approved or cleared by the [place]  FDA and  has been authorized for detection and/or diagnosis of SARS-CoV-2 by FDA under an Emergency Use Authorization (EUA). This EUA will remain  in effect (meaning this test can be used) for the duration of the COVID-19 declaration under Section 564(b)(1) of the Act, 21 U.S.C.section 360bbb-3(b)(1), unless the authorization is terminated  or revoked sooner.       Influenza A by PCR POSITIVE (A) NEGATIVE Final   Influenza B by PCR NEGATIVE NEGATIVE Final    Comment: (NOTE) The Xpert Xpress SARS-CoV-2/FLU/RSV plus assay is intended as an aid in the diagnosis of influenza from Nasopharyngeal swab specimens and should not be used as a sole basis for treatment. Nasal washings and aspirates are unacceptable for Xpert Xpress SARS-CoV-2/FLU/RSV testing.  Fact Sheet for Patients: bloggercourse.com  Fact Sheet for Healthcare  Providers: seriousbroker.it  This test is not yet approved or cleared by the [place]  FDA and has been authorized for detection and/or diagnosis of SARS-CoV-2 by FDA under an Emergency Use Authorization (EUA). This EUA will remain in effect (meaning this test can be used) for the duration of the COVID-19 declaration under Section 564(b)(1) of the Act, 21 U.S.C. section 360bbb-3(b)(1), unless the authorization is terminated or revoked.     Resp Syncytial Virus by PCR NEGATIVE NEGATIVE Final    Comment: (NOTE) Fact Sheet for Patients: bloggercourse.com  Fact Sheet for Healthcare Providers: seriousbroker.it  This test is not yet approved or cleared by the [place]  FDA and has been authorized for detection and/or diagnosis of SARS-CoV-2 by FDA under an Emergency Use Authorization (EUA). This EUA will remain in effect (meaning this test can be used) for the duration of the COVID-19 declaration under Section 564(b)(1) of the Act, 21 U.S.C. section 360bbb-3(b)(1), unless the authorization is terminated or revoked.  Performed at [facility], [address] [place], [place] [address]   Blood Culture (routine x 2)     Status: None (Preliminary result)   Collection Time: 08/19/23 10:15 AM   Specimen: BLOOD  Result Value Ref Range Status   Specimen Description BLOOD BLOOD RIGHT ARM forearm  Final   Special Requests   Final    BOTTLES DRAWN AEROBIC AND ANAEROBIC Blood Culture results may not be optimal due to an inadequate volume of blood received in culture bottles   Culture   Final    NO GROWTH < 24 HOURS Performed at [facility], [address] [place], [place] [address]    Report Status PENDING  Incomplete  Blood Culture (routine x 2)     Status: None (Preliminary result)   Collection Time: 08/19/23 10:54 AM   Specimen: BLOOD  Result Value Ref Range Status   Specimen Description BLOOD BLOOD  RIGHT ARM ac  Final   Special Requests   Final    BOTTLES DRAWN AEROBIC AND ANAEROBIC Blood Culture adequate volume   Culture   Final    NO GROWTH < 24 HOURS Performed at [facility] [address] [place], [place] [address]    Report Status PENDING  Incomplete     Time coordinating discharge: 35 minutes  SIGNED:   Adron JONETTA Fairly, DO [facility] 08/20/2023, 12:16 PM  If 7PM-7AM, please contact night-coverage [email or web address]

## 2023-08-20 NOTE — TOC Transition Note (Signed)
 Transition of Care [facility]) - Discharge Note   Patient Details  Name: Scott Buck MRN: [id number] Date of Birth: August 05, 1965  [facility] CM/SW Contact:  Sharlyne Stabs, RN Phone Number: 08/20/2023, 12:46 PM   Clinical Narrative:   TOC following patient was wean off oxygen. MD requesting Substance abuse resources to be given.  Patient admits to drinking 3 beers daily. Resources added to AVS for patient to review. Patient discharging home.    Final next level of care: Home/Self Care Barriers to Discharge: No Barriers Identified   Patient Goals and CMS Choice Patient states their goals for this hospitalization and ongoing recovery are:: to go home. CMS Medicare.gov Compare Post Acute Care list provided to:: Patient Choice offered to / list presented to : Patient     Discharge Placement      Patient and family notified of of transfer: 08/20/23  Discharge Plan and Services Additional resources added to the After Visit Summary for           Social Drivers of Health (SDOH) Interventions SDOH Screenings   Food Insecurity: No Food Insecurity (08/19/2023)  Housing: Low Risk  (08/19/2023)  Transportation Needs: No Transportation Needs (08/19/2023)  Utilities: Not At Risk (08/19/2023)  Tobacco Use: Medium Risk (08/19/2023)

## 2023-08-20 NOTE — Progress Notes (Signed)
 Mobility Specialist Progress Note:    08/20/23 1159  Mobility  Activity Ambulated with assistance in hallway  Level of Assistance Modified independent, requires aide device or extra time  Assistive Device None  Distance Ambulated (ft) 150 ft  Range of Motion/Exercises Active;All extremities  Activity Response Tolerated well  Mobility Referral Yes  Mobility visit 1 Mobility  Mobility Specialist Start Time (ACUTE ONLY) 1130  Mobility Specialist Stop Time (ACUTE ONLY) 1150  Mobility Specialist Time Calculation (min) (ACUTE ONLY) 20 min   Pt received in bed, agreeable to mobility. ModI to stand and ambulate with no AD. Tolerated well, SpO2 92% on RA throughout ambulation. Pt denies SOB. Returned to room, left pt sitting EOB. SpO2 93% on RA. All needs met.   Sherrilee Ditty Mobility Specialist Please contact via Special Educational Needs Teacher or  Rehab office at [phone]

## 2023-08-24 LAB — CULTURE, BLOOD (ROUTINE X 2)
Culture: NO GROWTH
Culture: NO GROWTH
Special Requests: ADEQUATE

## 2023-11-04 NOTE — Progress Notes (Signed)
 Lan Pin, male    DOB: [birth year]    MRN: [id number]   Brief patient profile:  58  yowm  quit smoking  08/2023  referred to pulmonary clinic in [place]  11/05/2023 by Dr Lewayne Records  for new onset doe onset fluA/pna > admit    Not prev seen by PCCM   Admit date: 08/19/2023   Discharge date: 08/20/2023 Admitted From:Home   Recommendations for Outpatient Follow-up:  Follow up with PCP in 1-2 weeks Continue Tamiflu  to complete course of treatment as prescribed Continue prednisone  as prescribed for 5 days Use inhaler as needed for shortness of breath or wheezing Antitussives prescribed Continue other home medications Counseled on tobacco cessation   Brief/Interim Summary: ASAHD CAN is a 58 y.o. male with medical history significant for hypertension, tobacco abuse, alcohol abuse, and anxiety disorder who initially presented to urgent care with complaints of coughing and wheezing over the last 4 days.  He also had some initial fevers and chills as well as nausea, vomiting, and diarrhea with poor oral intake over that same timeframe.  He was admitted for acute hypoxemic respiratory failure secondary to acute COPD exacerbation in the setting of influenza a pneumonia.  He was also noted to have prerenal AKI due to GI losses with nausea, vomiting, and diarrhea.  This has improved and he is back to baseline and not requiring oxygen any longer.  He will remain on Tamiflu  and prednisone  as prescribed to complete the course of treatment.  No other acute events or concerns noted.   Discharge Diagnoses:  Principal Problem:   Acute hypoxemic respiratory failure (HCC)   Principal discharge diagnosis: Acute hypoxemic respiratory failure secondary to COPD exacerbation in the setting of influenza A.  Prerenal AKI.     History of Present Illness  11/05/2023  Pulmonary/ 1st office eval/ [facility] / [place] [facility]  Chief Complaint  Patient presents with   Establish Care   Shortness of Breath     W/exertion Dx w/ pneumonia at hospital in [place] the flu   Dyspnea:  50% =  doe x 50-75 slt decline down to MB  and more sob back up hill to house now but not needing to stop  Cough: improved but still notable at hs and noct > non-productive  Sleep:  baseline on couch with variable pillows  SABA use: not using  02: none  Midline upper ant CP "every time deep breath"  but more noticeable sitting than walking  esp if bends a bit at the waist    No obvious day to day or daytime pattern/variability or assoc excess/ purulent sputum or mucus plugs or hemoptysis  or chest tightness, subjective wheeze or overt sinus or hb symptoms.    Also denies any obvious fluctuation of symptoms with weather or environmental changes or other aggravating or alleviating factors except as outlined above   No unusual exposure hx or h/o childhood pna/ asthma or knowledge of premature birth.  Current Allergies, Complete Past Medical History, Past Surgical History, Family History, and Social History were reviewed in [employer] record.  ROS  The following are not active complaints unless bolded Hoarseness, sore throat, dysphagia, dental problems, itching, sneezing,  nasal congestion or discharge of excess mucus or purulent secretions, ear ache,   fever, chills, sweats, unintended wt loss or wt gain, classically pleuritic or exertional cp,  orthopnea pnd or arm/hand swelling  or leg swelling, presyncope, palpitations, abdominal pain, anorexia, nausea, vomiting, diarrhea  or change  in bowel habits or change in bladder habits, change in stools or change in urine, dysuria, hematuria,  rash, arthralgias, visual complaints, headache, numbness, weakness or ataxia or problems with walking or coordination,  change in mood or  memory.            Outpatient Medications Prior to Visit  Medication Sig Dispense Refill   acetaminophen  (TYLENOL ) 500 MG tablet Take 1,000 mg by mouth as needed.       albuterol  (VENTOLIN  HFA) 108 (90 Base) MCG/ACT inhaler Inhale 2 puffs into the lungs every 6 (six) hours as needed for wheezing or shortness of breath. 8 g 2   clonazePAM  (KLONOPIN ) 2 MG tablet Take 2 mg by mouth daily.     ezetimibe -simvastatin  (VYTORIN ) 10-40 MG tablet Take 1 tablet by mouth daily.     ibuprofen (ADVIL) 200 MG tablet Take 400 mg by mouth every 6 (six) hours as needed for moderate pain (pain score 4-6).     losartan (COZAAR) 50 MG tablet Take 50 mg by mouth daily.     dextromethorphan -guaiFENesin  (MUCINEX  DM) 30-600 MG 12hr tablet Take 1 tablet by mouth 2 (two) times daily as needed for cough. 20 tablet 0   polyethylene glycol-electrolytes (NULYTELY/GOLYTELY) 420 g solution As directed (Patient not taking: Reported on 08/19/2023) 4000 mL 0   No facility-administered medications prior to visit.    Past Medical History:  Diagnosis Date   Anxiety    Hypercholesteremia    Hypertension       Objective:     BP (!) 162/82   Pulse 71   Ht 5\' 7"  (1.702 m)   Wt 167 lb (75.8 kg)   SpO2 95%   BMI 26.16 kg/m   SpO2: 95 % RA  Somber amb wm and    HEENT : Oropharynx  clear      NECK :  without  apparent JVD/ palpable Nodes/TM    LUNGS: no acc muscle use,  Min barrel  contour chest wall with bilateral  slightly decreased bs s audible wheeze and  without cough on insp or exp maneuvers and min  Hyperresonant  to  percussion bilaterally    CV:  RRR  no s3 or murmur or increase in P2, and no edema   ABD:  soft and nontender with pos end  insp Hoover's  in the supine position.  No bruits or organomegaly appreciated   MS:  Nl gait/ ext warm without deformities Or obvious joint restrictions  calf tenderness, cyanosis or clubbing     SKIN: warm and dry without lesions    NEURO:  alert, approp, nl sensorium with  no motor or cerebellar deficits apparent.        CXR PA and Lateral:   11/05/2023 :    I personally reviewed images and impression is as follows:     No  significant findings   Labs ordered/ reviewed:      Chemistry      Component Value Date/Time   NA 141 11/05/2023 0917   K 4.7 11/05/2023 0917   CL 103 11/05/2023 0917   CO2 24 11/05/2023 0917   BUN 13 11/05/2023 0917   CREATININE 1.01 11/05/2023 0917      Component Value Date/Time   CALCIUM 10.1 11/05/2023 0917   ALKPHOS 40 08/19/2023 1015   AST 275 (H) 08/19/2023 1015   ALT 160 (H) 08/19/2023 1015   BILITOT 1.1 08/19/2023 1015   BILITOT <0.2 03/25/2022 1601  Lab Results  Component Value Date   WBC 5.1 11/05/2023   HGB 14.9 11/05/2023   HCT 44.9 11/05/2023   MCV 96 11/05/2023   PLT 200 11/05/2023     No results found for: "DDIMER"    Lab Results  Component Value Date   TSH 0.942 11/05/2023     BNP        22    11/05/2023       Lab Results  Component Value Date   ESRSEDRATE 2 11/05/2023          Assessment   DOE (dyspnea on exertion) Stopped smoking 08/2023 in setting Flu A with pna - 11/05/2023   Walked on RA   x  3  lap(s) =  approx 450  ft  @ brisk pace, stopped due to end of study  with lowest 02 sats 96% and no sob   - Allergy screen 11/05/2023 >  Eos 0.1 /  IgE  114  Alpha one MM  level 148 - PFTs 11/05/2023 >>>>     Symptoms are markedly disproportionate to objective findings and not clear to what extent this is actually a pulmonary  problem but pt does appear to have difficult to sort out respiratory symptoms of unknown origin for which  DDX  = almost all start with A and  include Adherence, Ace Inhibitors, Acid Reflux, Active Sinus Disease, Alpha 1 Antitripsin deficiency, Anxiety masquerading as Airways dz,  ABPA,  Allergy(esp in young), Aspiration (esp in elderly), Adverse effects of meds,  Active smoking or Vaping, A bunch of PE's/clot burden (a few small clots can't cause this syndrome unless there is already severe underlying pulm or vascular dz with poor reserve),  Anemia or thyroid disorder, plus two Bs  = Bronchiectasis and Beta blocker  use..and one C= CHF    Bolded dx's worth considering and be mostly excluded based on DOE lab profile sent today considering cxr is now wnl and he does not desaturate with exertion.  The exception is anxiety/depression/ deconditioning which are dx's of exclusion  Rec regular sub max ex at least 30 min daily with f/u with pft's in 3 months   Discussed in detail all the  indications, usual  risks and alternatives  relative to the benefits with patient who agrees to proceed with Rx as outlined.      Each maintenance medication was reviewed in detail including emphasizing most importantly the difference between maintenance and prns and under what circumstances the prns are to be triggered using an action plan format where appropriate.  Total time for H and P, chart review, counseling,  directly observing portions of ambulatory 02 saturation study/ and generating customized AVS unique to this office visit / same day charting = 60 min for new pt with multiple  refractory respiratory  symptoms of uncertain etiology                  Vernestine Gondola, MD 11/05/2023

## 2023-11-05 ENCOUNTER — Encounter: Payer: Self-pay | Admitting: Internal Medicine

## 2023-11-05 ENCOUNTER — Ambulatory Visit: Admitting: Internal Medicine

## 2023-11-05 ENCOUNTER — Ambulatory Visit (HOSPITAL_COMMUNITY)
Admission: RE | Admit: 2023-11-05 | Discharge: 2023-11-05 | Disposition: A | Discharge status: Home / Self Care | Source: Ambulatory Visit | Attending: Internal Medicine | Admitting: Internal Medicine

## 2023-11-05 VITALS — BP 162/82 | HR 71 | Ht 67.0 in | Wt 167.0 lb

## 2023-11-05 DIAGNOSIS — R0609 Other forms of dyspnea: Secondary | ICD-10-CM | POA: Insufficient documentation

## 2023-11-05 DIAGNOSIS — Z87891 Personal history of nicotine dependence: Secondary | ICD-10-CM

## 2023-11-05 MED ORDER — PANTOPRAZOLE SODIUM 40 MG PO TBEC: 40.0000 mg | DELAYED_RELEASE_TABLET | Freq: Every day | ORAL | 2 refills | Status: DC

## 2023-11-05 MED ORDER — FAMOTIDINE 20 MG PO TABS: ORAL_TABLET | ORAL | 11 refills | Status: DC

## 2023-11-05 NOTE — Patient Instructions (Signed)
 To get the most out of exercise, you need to be continuously aware that you are short of breath, but never out of breath, for at least 30 minutes daily. As you improve, it will actually be easier for you to do the same amount of exercise  in  30 minutes so always push to the level where you are short of breath.     Pantoprazole  (protonix ) 40 mg   Take  30-60 min before first meal of the day and Pepcid  (famotidine )  20 mg after supper for at least  a full 4 weeks to see if any of your symptoms improve and if so stay on them until return   GERD (REFLUX)  is an extremely common cause of respiratory symptoms just like yours , many times with no obvious heartburn at all.    It can be treated with medication, but also with lifestyle changes including elevation of the head of your bed (ideally with 6 -8inch blocks under the headboard of your bed),  Smoking cessation, avoidance of late meals, excessive alcohol, and avoid fatty foods, chocolate, peppermint, colas, red wine, and acidic juices such as orange juice.  NO MINT OR MENTHOL PRODUCTS SO NO COUGH DROPS  USE SUGARLESS CANDY INSTEAD (Jolley ranchers or Stover's or Life Savers) or even ice chips will also do - the key is to swallow to prevent all throat clearing. NO OIL BASED VITAMINS - use powdered substitutes.  Avoid fish oil when coughing.    Please remember to go to the lab department   for your tests - we will call you with the results when they are available.      Please remember to go to the  x-ray department  @  [facility] for your tests - we will call you with the results when they are available      Schedule PFTs and see me in 3 months

## 2023-11-06 NOTE — Assessment & Plan Note (Addendum)
 Stopped smoking 08/2023 in setting Flu A with pna - 11/05/2023   Walked on RA   x  3  lap(s) =  approx 450  ft  @ brisk pace, stopped due to end of study  with lowest 02 sats 96% and no sob   - Allergy screen 11/05/2023 >  Eos 0.1 /  IgE  114  Alpha one MM  level 148 - PFTs 11/05/2023 >>>>     Symptoms are markedly disproportionate to objective findings and not clear to what extent this is actually a pulmonary  problem but pt does appear to have difficult to sort out respiratory symptoms of unknown origin for which  DDX  = almost all start with A and  include Adherence, Ace Inhibitors, Acid Reflux, Active Sinus Disease, Alpha 1 Antitripsin deficiency, Anxiety masquerading as Airways dz,  ABPA,  Allergy(esp in young), Aspiration (esp in elderly), Adverse effects of meds,  Active smoking or Vaping, A bunch of PE's/clot burden (a few small clots can't cause this syndrome unless there is already severe underlying pulm or vascular dz with poor reserve),  Anemia or thyroid disorder, plus two Bs  = Bronchiectasis and Beta blocker use..and one C= CHF    Bolded dx's worth considering and be mostly excluded based on DOE lab profile sent today considering cxr is now wnl and he does not desaturate with exertion.  The exception is anxiety/depression/ deconditioning which are dx's of exclusion  Rec regular sub max ex at least 30 min daily with f/u with pft's in 3 months   Discussed in detail all the  indications, usual  risks and alternatives  relative to the benefits with patient who agrees to proceed with Rx as outlined.      Each maintenance medication was reviewed in detail including emphasizing most importantly the difference between maintenance and prns and under what circumstances the prns are to be triggered using an action plan format where appropriate.  Total time for H and P, chart review, counseling,  directly observing portions of ambulatory 02 saturation study/ and generating customized AVS unique to  this office visit / same day charting = 60 min for new pt with multiple  refractory respiratory  symptoms of uncertain etiology

## 2023-11-11 ENCOUNTER — Telehealth: Payer: Self-pay

## 2023-11-11 ENCOUNTER — Encounter: Payer: Self-pay | Admitting: Internal Medicine

## 2023-11-11 LAB — CBC WITH DIFFERENTIAL/PLATELET
Basophils Absolute: 0 10*3/uL (ref 0.0–0.2)
Basos: 1 %
EOS (ABSOLUTE): 0.1 10*3/uL (ref 0.0–0.4)
Eos: 2 %
Hematocrit: 44.9 % (ref 37.5–51.0)
Hemoglobin: 14.9 g/dL (ref 13.0–17.7)
Immature Grans (Abs): 0 10*3/uL (ref 0.0–0.1)
Immature Granulocytes: 1 %
Lymphocytes Absolute: 1.9 10*3/uL (ref 0.7–3.1)
Lymphs: 38 %
MCH: 31.9 pg (ref 26.6–33.0)
MCHC: 33.2 g/dL (ref 31.5–35.7)
MCV: 96 fL (ref 79–97)
Monocytes Absolute: 0.4 10*3/uL (ref 0.1–0.9)
Monocytes: 8 %
Neutrophils Absolute: 2.6 10*3/uL (ref 1.4–7.0)
Neutrophils: 50 %
Platelets: 200 10*3/uL (ref 150–450)
RBC: 4.67 x10E6/uL (ref 4.14–5.80)
RDW: 12.2 % (ref 11.6–15.4)
WBC: 5.1 10*3/uL (ref 3.4–10.8)

## 2023-11-11 LAB — SEDIMENTATION RATE: Sed Rate: 2 mm/h (ref 0–30)

## 2023-11-11 LAB — ALPHA-1-ANTITRYPSIN PHENOTYP: A-1 Antitrypsin: 148 mg/dL (ref 101–187)

## 2023-11-11 LAB — BASIC METABOLIC PANEL WITH GFR
BUN/Creatinine Ratio: 13 (ref 10–24)
BUN: 13 mg/dL (ref 8–27)
CO2: 24 mmol/L (ref 20–29)
Calcium: 10.1 mg/dL (ref 8.6–10.2)
Chloride: 103 mmol/L (ref 96–106)
Creatinine, Ser: 1.01 mg/dL (ref 0.76–1.27)
Glucose: 96 mg/dL (ref 70–99)
Potassium: 4.7 mmol/L (ref 3.5–5.2)
Sodium: 141 mmol/L (ref 134–144)
eGFR: 85 mL/min/{1.73_m2} (ref >59)

## 2023-11-11 LAB — BRAIN NATRIURETIC PEPTIDE: BNP: 22.2 pg/mL (ref 0.0–100.0)

## 2023-11-11 LAB — IGE: IgE (Immunoglobulin E), Serum: 114 [IU]/mL (ref 6–495)

## 2023-11-11 LAB — TSH: TSH: 0.942 u[IU]/mL (ref 0.450–4.500)

## 2023-11-11 NOTE — Telephone Encounter (Signed)
 Copied from CRM [id number]. Topic: General - Other >> Nov 10, 2023  1:40 PM Eveleen Hinds B wrote: Reason for CRM:  Patient calling to check CT and labs... Okay to call Work number [phone]. >> Nov 10, 2023  4:46 PM Glenora Laos B wrote: I do not see an order for a ct and Labs would not go through the [facility] I will forward this to the nurse  Spoke with patient regarding prior message.Per Dr.Wert Labs has been seen by patient. Advised patient CXR is taking longer 3-4 weeks for the result's to come back . Patient stated he had seen something on his after visit summary . Patient was at work and didn't have the time to look in his phone will post issue on mychart later .

## 2023-11-19 ENCOUNTER — Encounter: Payer: Self-pay | Admitting: *Deleted

## 2023-12-15 ENCOUNTER — Encounter: Payer: Self-pay | Admitting: Internal Medicine

## 2024-01-08 ENCOUNTER — Encounter: Payer: Self-pay | Admitting: Internal Medicine

## 2024-01-08 ENCOUNTER — Ambulatory Visit: Attending: Internal Medicine | Admitting: Internal Medicine

## 2024-01-08 VITALS — BP 142/82 | HR 75 | Ht 67.0 in | Wt 170.8 lb

## 2024-01-08 DIAGNOSIS — E7849 Other hyperlipidemia: Secondary | ICD-10-CM | POA: Diagnosis not present

## 2024-01-08 DIAGNOSIS — I10 Essential (primary) hypertension: Secondary | ICD-10-CM | POA: Diagnosis not present

## 2024-01-08 DIAGNOSIS — E785 Hyperlipidemia, unspecified: Secondary | ICD-10-CM | POA: Insufficient documentation

## 2024-01-08 DIAGNOSIS — R0609 Other forms of dyspnea: Secondary | ICD-10-CM

## 2024-01-08 DIAGNOSIS — R002 Palpitations: Secondary | ICD-10-CM | POA: Diagnosis not present

## 2024-01-08 NOTE — Progress Notes (Signed)
 Cardiology Office Note  Date: 01/08/2024   ID: Scott Buck, DOB 12-16-1965, MRN [id number]  PCP:  Scott Satterfield, MD  Cardiologist:  Scott SHAUNNA Maywood, MD Electrophysiologist:  None   History of Present Illness: Scott Buck is a 58 y.o. male  Referred to cardiology clinic for evaluation of DOE.  He was hospitalized in 12/01/2023 for acute hypoxic respite failure secondary to influenza infection and COPD exacerbation.  Since discharge, he continued to have DOE.  However he is able to walk for at least 0.5 mile before he gets short of breath.  No orthopnea, PND or leg swelling.  No angina.  He was seen by pulmonary, recommended antacids/PPI for DOE, but patient did not notice any improvement in his DOE symptoms.  PFTs are pending.  BNP normal in April 25.  No dizziness or syncope either.  However he did report palpitations lasting for at least 30 seconds but less than a minute and occurs once a month.  Past Medical History:  Diagnosis Date   Anxiety    Hypercholesteremia    Hypertension     Past Surgical History:  Procedure Laterality Date   COLONOSCOPY N/A 04/27/2014   Dr. Harvey: 2 polyps removed, one was a simple adenoma.  Next colonoscopy 5 years.   KNEE SURGERY Right 1990   To repair right patella   NECK SURGERY  05/15/2021    Current Outpatient Medications  Medication Sig Dispense Refill   acetaminophen  (TYLENOL ) 500 MG tablet Take 1,000 mg by mouth as needed.      albuterol  (PROVENTIL ) 2 MG tablet Take 2 mg by mouth 3 (three) times daily.     azithromycin  (ZITHROMAX ) 250 MG tablet Take 250 mg by mouth daily.     clonazePAM  (KLONOPIN ) 2 MG tablet Take 2 mg by mouth daily.     ezetimibe -simvastatin  (VYTORIN ) 10-40 MG tablet Take 1 tablet by mouth daily.     ibuprofen (ADVIL) 200 MG tablet Take 400 mg by mouth every 6 (six) hours as needed for moderate pain (pain score 4-6).     losartan (COZAAR) 50 MG tablet Take 50 mg by mouth daily.     No current  facility-administered medications for this visit.   Allergies:  Patient has no known allergies.   Social History: The patient  reports that he quit smoking about 4 months ago. His smoking use included cigarettes. He has a 26.3 pack-year smoking history. He has never used smokeless tobacco. He reports current alcohol use. He reports that he does not use drugs.   Family History: The patient's family history includes Cancer - Lung in his father; Cancer - Other in his mother; Colon polyps in his father and sister; Emphysema in his father; Hypercholesterolemia in his brother, brother, and sister; Hypertension in his brother, brother, and sister; Pulmonary fibrosis in his father.   ROS:  Please see the history of present illness. Otherwise, complete review of systems is positive for none.  All other systems are reviewed and negative.   Physical Exam: VS:  BP (!) 142/82   Pulse 75   Ht 5' 7 (1.702 m)   Wt 170 lb 12.8 oz (77.5 kg)   SpO2 96%   BMI 26.75 kg/m , BMI Body mass index is 26.75 kg/m.  Wt Readings from Last 3 Encounters:  01/08/24 170 lb 12.8 oz (77.5 kg)  11/05/23 167 lb (75.8 kg)  08/19/23 160 lb 11.5 oz (72.9 kg)    General: Patient appears comfortable at rest. HEENT:  Conjunctiva and lids normal, oropharynx clear with moist mucosa. Neck: Supple, no elevated JVP or carotid bruits, no thyromegaly. Lungs: Clear to auscultation, nonlabored breathing at rest. Cardiac: Regular rate and rhythm, no S3 or significant systolic murmur, no pericardial rub. Abdomen: Soft, nontender, no hepatomegaly, bowel sounds present, no guarding or rebound. Extremities: No pitting edema, distal pulses 2+. Skin: Warm and dry. Musculoskeletal: No kyphosis. Neuropsychiatric: Alert and oriented x3, affect grossly appropriate.  Recent Labwork: 08/19/2023: ALT 160; AST 275 08/20/2023: Magnesium 2.3 11/05/2023: BNP 22.2; BUN 13; Creatinine, Ser 1.01; Hemoglobin 14.9; Platelets 200; Potassium 4.7; Sodium 141;  TSH 0.942  No results found for: CHOL, TRIG, HDL, CHOLHDL, VLDL, LDLCALC, LDLDIRECT   Assessment and Plan:  DOE: Ongoing DOE since 12/01/2023 hospitalization for acute hypoxic respiratory failure secondary to influenza infection and COPD exacerbation. Can walk for 0.5 mile before he gets short of breath.  No orthopnea, PND or leg swelling.  No angina.  He tried antacid/PPI for 1 month but did not notice any improvement in his DOE.  BNP normal.  PFTs pending.  Obtain echocardiogram and Lexiscan.  He has neck and back issues, cannot do treadmill.  Palpitations: Occurs once a month, last for 30 seconds.  If it occurs more frequently and last more than a few minutes, will need event monitor.  But currently, he does not need any event monitor.  He will reach out to us  if he has frequent palpitations in the future.  HTN, controlled: Continue losartan 50 mg once daily.  HLD, known values: Continue ezetimibe -simvastatin  10-40 mg once daily.  Goal LDL is less than 100.    Medication Adjustments/Labs and Tests Ordered: Current medicines are reviewed at length with the patient today.  Concerns regarding medicines are outlined above.    Disposition:  Follow up pending results  Signed, Scott Buzby Scott Maywood, MD, 01/08/2024 10:22 AM    [facility] [address], [place], [place] [address]

## 2024-01-08 NOTE — Patient Instructions (Signed)
 Medication Instructions:  Your physician recommends that you continue on your current medications as directed. Please refer to the Current Medication list given to you today.  *If you need a refill on your cardiac medications before your next appointment, please call your pharmacy*  Lab Work: None If you have labs (blood work) drawn today and your tests are completely normal, you will receive your results only by: MyChart Message (if you have MyChart) OR A paper copy in the mail If you have any lab test that is abnormal or we need to change your treatment, we will call you to review the results.  Testing/Procedures: Your physician has requested that you have an echocardiogram. Echocardiography is a painless test that uses sound waves to create images of your heart. It provides your doctor with information about the size and shape of your heart and how well your heart's chambers and valves are working. This procedure takes approximately one hour. There are no restrictions for this procedure. Please do NOT wear cologne, perfume, aftershave, or lotions (deodorant is allowed). Please arrive 15 minutes prior to your appointment time.  Please note: We ask at that you not bring children with you during ultrasound (echo/ vascular) testing. Due to room size and safety concerns, children are not allowed in the ultrasound rooms during exams. Our front office staff cannot provide observation of children in our lobby area while testing is being conducted. An adult accompanying a patient to their appointment will only be allowed in the ultrasound room at the discretion of the ultrasound technician under special circumstances. We apologize for any inconvenience.   Your physician has requested that you have a lexiscan myoview. For further information please visit [email or web address] Please follow instruction sheet, as given.   Follow-Up: At [facility], you and your health needs are our priority.   As part of our continuing mission to provide you with exceptional heart care, our providers are all part of one team.  This team includes your primary Cardiologist (physician) and Advanced Practice Providers or APPs (Physician Assistants and Nurse Practitioners) who all work together to provide you with the care you need, when you need it.  Your next appointment:    Follow up to be determined   Provider:   You may see Vishnu P Mallipeddi, MD or one of the following Advanced Practice Providers on your designated Care Team:   [place], PA-C  [place]ia [place], [place] Olivia Pavy, [place]     We recommend signing up for the patient portal called MyChart.  Sign up information is provided on this After Visit Summary.  MyChart is used to connect with patients for Virtual Visits (Telemedicine).  Patients are able to view lab/test results, encounter notes, upcoming appointments, etc.  Non-urgent messages can be sent to your provider as well.   To learn more about what you can do with MyChart, go to [email or web address].   Other Instructions

## 2024-02-02 ENCOUNTER — Encounter (HOSPITAL_COMMUNITY)

## 2024-02-02 ENCOUNTER — Ambulatory Visit: Admitting: Internal Medicine

## 2024-02-10 ENCOUNTER — Ambulatory Visit (HOSPITAL_COMMUNITY)
Admission: RE | Admit: 2024-02-10 | Discharge: 2024-02-10 | Disposition: A | Discharge status: Home / Self Care | Source: Ambulatory Visit | Attending: Internal Medicine | Admitting: Internal Medicine

## 2024-02-10 ENCOUNTER — Encounter (HOSPITAL_COMMUNITY): Payer: Self-pay

## 2024-02-10 ENCOUNTER — Encounter (HOSPITAL_COMMUNITY)

## 2024-02-10 ENCOUNTER — Other Ambulatory Visit: Payer: Self-pay | Admitting: Physician Assistant

## 2024-02-10 ENCOUNTER — Encounter (HOSPITAL_BASED_OUTPATIENT_CLINIC_OR_DEPARTMENT_OTHER)
Admission: RE | Admit: 2024-02-10 | Discharge: 2024-02-10 | Disposition: A | Discharge status: Home / Self Care | Source: Ambulatory Visit | Attending: Internal Medicine | Admitting: Internal Medicine

## 2024-02-10 DIAGNOSIS — R0609 Other forms of dyspnea: Secondary | ICD-10-CM | POA: Diagnosis not present

## 2024-02-10 LAB — NM MYOCAR MULTI W/SPECT W/WALL MOTION / EF
Base ST Depression (mm): 0 mm
LV dias vol: 82 mL (ref 62–150)
LV sys vol: 36 mL (ref 4.2–5.8)
MPHR: 160 {beats}/min
Nuc Stress EF: 57 %
Peak HR: 106 {beats}/min
Percent HR: 66 %
RATE: 0.3
Rest HR: 61 {beats}/min
Rest Nuclear Isotope Dose: 10.5 mCi
SDS: 2
SRS: 5
SSS: 7
ST Depression (mm): 0 mm
Stress Nuclear Isotope Dose: 32 mCi
TID: 1.26

## 2024-02-10 LAB — ECHOCARDIOGRAM COMPLETE
Area-P 1/2: 2.78 cm2
S' Lateral: 2.4 cm

## 2024-02-10 MED ORDER — TECHNETIUM TC 99M TETROFOSMIN IV KIT
10.0000 | PACK | Freq: Once | INTRAVENOUS | Status: AC | PRN
Start: 2024-02-10 — End: 2024-02-10
  Administered 2024-02-10: 10.5 via INTRAVENOUS

## 2024-02-10 MED ORDER — SODIUM CHLORIDE FLUSH 0.9 % IV SOLN
INTRAVENOUS | Status: AC
  Filled 2024-02-10: qty 10

## 2024-02-10 MED ORDER — REGADENOSON 0.4 MG/5ML IV SOLN
INTRAVENOUS | Status: AC
  Administered 2024-02-10: 0.4 mg via INTRAVENOUS
  Filled 2024-02-10: qty 5

## 2024-02-10 MED ORDER — TECHNETIUM TC 99M TETROFOSMIN IV KIT
30.0000 | PACK | Freq: Once | INTRAVENOUS | Status: AC | PRN
  Administered 2024-02-10: 32 via INTRAVENOUS

## 2024-02-10 NOTE — Progress Notes (Signed)
     Scott Buck presented for a Lexiscan  nuclear stress test today.  I Lorette CINDERELLA Kapur, PA-C, provided direct supervision and was present during the stress portion of the study today, which was completed without significant symptoms, immediate complications, or acute ST/T changes on ECG.  Stress imaging is pending at this time.  Preliminary ECG findings may be listed in the chart, but the stress test result will not be finalized until perfusion imaging is complete.  Lorette CINDERELLA Kapur, PA-C  02/10/2024, 10:38 AM

## 2024-02-11 ENCOUNTER — Ambulatory Visit: Payer: Self-pay | Admitting: Internal Medicine

## 2024-03-06 NOTE — Progress Notes (Unsigned)
 Scott Buck, male    DOB: April 21, 1966    MRN: [id number]   Brief patient profile:  58  yowm  quit smoking  08/2023/MM  referred to pulmonary clinic in [place]  11/05/2023 by Dr Bertell  for new onset doe onset fluA/pna > admit    Not prev seen by PCCM   Admit date: 08/19/2023   Discharge date: 08/20/2023 Admitted From:Home   Recommendations for Outpatient Follow-up:  Follow up with PCP in 1-2 weeks Continue Tamiflu  to complete course of treatment as prescribed Continue prednisone  as prescribed for 5 days Use inhaler as needed for shortness of breath or wheezing Antitussives prescribed Continue other home medications Counseled on tobacco cessation   Brief/Interim Summary: Scott Buck is a 58 y.o. male with medical history significant for hypertension, tobacco abuse, alcohol abuse, and anxiety disorder who initially presented to urgent care with complaints of coughing and wheezing over the last 4 days.  He also had some initial fevers and chills as well as nausea, vomiting, and diarrhea with poor oral intake over that same timeframe.  He was admitted for acute hypoxemic respiratory failure secondary to acute COPD exacerbation in the setting of influenza a pneumonia.  He was also noted to have prerenal AKI due to GI losses with nausea, vomiting, and diarrhea.  This has improved and he is back to baseline and not requiring oxygen any longer.  He will remain on Tamiflu  and prednisone  as prescribed to complete the course of treatment.  No other acute events or concerns noted.   Discharge Diagnoses:  Principal Problem:   Acute hypoxemic respiratory failure (HCC)   Principal discharge diagnosis: Acute hypoxemic respiratory failure secondary to COPD exacerbation in the setting of influenza A.  Prerenal AKI.     History of Present Illness  11/05/2023  Pulmonary/ 1st office eval/ Scott Buck / [place] [facility]  Chief Complaint  Patient presents with   Establish Care   Shortness of Breath     W/exertion Dx w/ pneumonia at hospital in [place] the flu   Dyspnea:  50% =  doe x 50-75 slt decline down to MB  and more sob back up hill to house now but not needing to stop  Cough: improved but still notable at hs and noct > non-productive  Sleep:  baseline on couch with variable pillows  SABA use: not using  02: none  Midline upper ant CP every time deep breath  but more noticeable sitting than walking  esp if bends a bit at the waist Rec To get the most out of exercise, you need to be continuously aware that you are short of breath, but never out of breath, for at least 30 minutes daily.   Pantoprazole  (protonix ) 40 mg   Take  30-60 min before first meal of the day and Pepcid  (famotidine )  20 mg after supper for at least  a full 4 weeks to see if any of your symptoms improve and if so stay on them until return  GERD diet reviewed, bed blocks rec Allergy screen 11/05/2023 >  Eos 0.1 /  IgE  114  Alpha one MM  level 148 Cxr ok x for hyperinflation Schedule PFTs and see me in 3 months     03/08/2024  f/u ov/ office/Scott Buck re: GOLD 1 still not smoking maint on prn saba  Chief Complaint  Patient presents with   Shortness of Breath    DOE a little better - pft this morning  Dyspnea:  improved,  mb and back fine now / limited by R sciatica more than breathing  Cough: none  Sleeping: on couch flat part/ 2 pillows s    esp cc  SABA use: none  02: none    No obvious day to day or daytime variability or assoc excess/ purulent sputum or mucus plugs or hemoptysis or cp or chest tightness, subjective wheeze or overt sinus or hb symptoms.    Also denies any obvious fluctuation of symptoms with weather or environmental changes or other aggravating or alleviating factors except as outlined above   No unusual exposure hx or h/o childhood pna/ asthma or knowledge of premature birth.  Current Allergies, Complete Past Medical History, Past Surgical History, Family History, and Social  History were reviewed in [employer] record.  ROS  The following are not active complaints unless bolded Hoarseness, sore throat, dysphagia, dental problems, itching, sneezing,  nasal congestion or discharge of excess mucus or purulent secretions, ear ache,   fever, chills, sweats, unintended wt loss or wt gain, classically pleuritic or exertional cp,  orthopnea pnd or arm/hand swelling  or leg swelling, presyncope, palpitations, abdominal pain, anorexia, nausea, vomiting, diarrhea  or change in bowel habits or change in bladder habits, change in stools or change in urine, dysuria, hematuria,  rash, arthralgias, visual complaints, headache, numbness, weakness or ataxia or problems with walking due to R sided sciatica  or coordination,  change in mood or  memory.        Current Meds  Medication Sig   acetaminophen  (TYLENOL ) 500 MG tablet Take 1,000 mg by mouth as needed.    clonazePAM  (KLONOPIN ) 2 MG tablet Take 2 mg by mouth daily.   ezetimibe -simvastatin  (VYTORIN ) 10-40 MG tablet Take 1 tablet by mouth daily.   famotidine  (PEPCID ) 20 MG tablet Take 20 mg by mouth daily.   ibuprofen (ADVIL) 200 MG tablet Take 400 mg by mouth every 6 (six) hours as needed for moderate pain (pain score 4-6).   losartan (COZAAR) 50 MG tablet Take 50 mg by mouth daily.   pantoprazole  (PROTONIX ) 40 MG tablet Take 40 mg by mouth daily.           Past Medical History:  Diagnosis Date   Anxiety    Hypercholesteremia    Hypertension       Objective:    Wts  03/08/2024        175   01/08/24 170 lb 12.8 oz (77.5 kg)  11/05/23 167 lb (75.8 kg)  08/19/23 160 lb 11.5 oz (72.9 kg)      Vital signs reviewed  03/08/2024  - Note at rest 02 sats  99% on RA   General appearance:    amb somber wm nad     HEENT : Oropharynx  clear   Nasal turbinates nl    NECK :  without  apparent JVD/ palpable Nodes/TM    LUNGS: no acc muscle use,  Min barrel  contour chest wall with bilateral   slightly decreased bs s audible wheeze and  without cough on insp or exp maneuvers and min  Hyperresonant  to  percussion bilaterally    CV:  RRR  no s3 or murmur or increase in P2, and no edema   ABD:  soft and nontender with pos end  insp Hoover's  in the supine position.  No bruits or organomegaly appreciated   MS:  Nl gait/ ext warm without deformities Or obvious joint restrictions  calf tenderness, cyanosis or  clubbing     SKIN: warm and dry without lesions    NEURO:  alert, approp, nl sensorium with  no motor or cerebellar deficits apparent.          Assessment   Assessment & Plan DOE (dyspnea on exertion) Stopped smoking 08/2023/MM   in setting Flu A with pna - 11/05/2023   Walked on RA   x  3  lap(s) =  approx 450  ft  @ brisk pace, stopped due to end of study  with lowest 02 sats 96% and no sob   - Allergy screen 11/05/2023 >  Eos 0.1 /  IgE  114  Alpha one MM  level 148 -PFT's  03/08/2024   FEV1 2.59 (80 % ) ratio 0.65  p 11  % improvement from saba p nothing  prior to study with DLCO  20.7 (82%)   and FV curve min concave    So he is GOLD 1  /Group A COPD and not likely to progress to more severe stages of copd unless he resumes smoking and can just use saba prn  for now -see AVS re approp saba use   Former cigarette smoker Quit 08/2023 so eligible for LDSCT thru 20240   Pt wishes to defer to PCP re scheduling LDSCT as part of annual health eval   Pulmonary clinic f/u is prn          Each maintenance medication was reviewed in detail including emphasizing most importantly the difference between maintenance and prns and under what circumstances the prns are to be triggered using an action plan format where appropriate.  Total time for H and P, chart review, counseling, reviewing hfa  device(s) and generating customized AVS unique to this office visit / same day charting = 25 min           AVS  Patient Instructions  You have very mild COPD (stage 1) which is very  unlikely to progress unless you start smoking again   Use your albuterol  as a rescue medication to be used if you can't catch your breath by resting, slowing your pace,  or doing a relaxed purse lip breathing pattern.  - The less you use it, the better it will work when you need it. - Ok to use up to 2 puffs  every 4 hours if you must but call for  appointment if use goes up over your usual need - Don't leave home without it !!  (think of it like a spare tire or starter fluid for your car)  Also  Ok to try albuterol  15 min before an activity (on alternating days)  that you know would usually make you short of breath and see if it makes any difference and if makes none then don't take albuterol  after activity unless you can't catch your breath as this means it's the resting that helps, not the albuterol .    Stop protonix  (Prilosec) and start taking pepcid  20 mg twice daily for a week then once a day for a week then stop.  Lung cancer screening per PCP  - follow up here is as needed      Ozell America, MD 03/09/2024

## 2024-03-08 ENCOUNTER — Encounter: Payer: Self-pay | Admitting: Internal Medicine

## 2024-03-08 ENCOUNTER — Ambulatory Visit (HOSPITAL_COMMUNITY)
Admission: RE | Admit: 2024-03-08 | Discharge: 2024-03-08 | Disposition: A | Discharge status: Home / Self Care | Source: Ambulatory Visit | Attending: Internal Medicine | Admitting: Internal Medicine

## 2024-03-08 ENCOUNTER — Ambulatory Visit: Admitting: Internal Medicine

## 2024-03-08 VITALS — BP 148/84 | HR 84 | Ht 67.0 in | Wt 175.4 lb

## 2024-03-08 DIAGNOSIS — Z87891 Personal history of nicotine dependence: Secondary | ICD-10-CM | POA: Diagnosis not present

## 2024-03-08 DIAGNOSIS — R0609 Other forms of dyspnea: Secondary | ICD-10-CM | POA: Insufficient documentation

## 2024-03-08 LAB — PULMONARY FUNCTION TEST
DL/VA % pred: 75 %
DL/VA: 3.2 ml/min/mmHg/L
DLCO unc % pred: 82 %
DLCO unc: 20.72 ml/min/mmHg
FEF 25-75 Post: 1.91 L/s
FEF 25-75 Pre: 1.21 L/s
FEF2575-%Change-Post: 56 %
FEF2575-%Pred-Post: 70 %
FEF2575-%Pred-Pre: 44 %
FEV1-%Change-Post: 11 %
FEV1-%Pred-Post: 80 %
FEV1-%Pred-Pre: 71 %
FEV1-Post: 2.59 L
FEV1-Pre: 2.33 L
FEV1FVC-%Change-Post: 0 %
FEV1FVC-%Pred-Pre: 85 %
FEV6-%Change-Post: 12 %
FEV6-%Pred-Post: 96 %
FEV6-%Pred-Pre: 85 %
FEV6-Post: 3.92 L
FEV6-Pre: 3.48 L
FEV6FVC-%Change-Post: 0 %
FEV6FVC-%Pred-Post: 103 %
FEV6FVC-%Pred-Pre: 102 %
FVC-%Change-Post: 10 %
FVC-%Pred-Post: 93 %
FVC-%Pred-Pre: 83 %
FVC-Post: 3.98 L
FVC-Pre: 3.59 L
Post FEV1/FVC ratio: 65 %
Post FEV6/FVC ratio: 98 %
Pre FEV1/FVC ratio: 65 %
Pre FEV6/FVC Ratio: 98 %
RV % pred: 167 %
RV: 3.48 L
TLC % pred: 117 %
TLC: 7.53 L

## 2024-03-08 MED ORDER — ALBUTEROL SULFATE (2.5 MG/3ML) 0.083% IN NEBU
2.5000 mg | INHALATION_SOLUTION | Freq: Once | RESPIRATORY_TRACT | Status: AC
  Administered 2024-03-08: 2.5 mg via RESPIRATORY_TRACT

## 2024-03-08 NOTE — Patient Instructions (Signed)
 You have very mild COPD (stage 1) which is very unlikely to progress unless you start smoking again   Use your albuterol  as a rescue medication to be used if you can't catch your breath by resting, slowing your pace,  or doing a relaxed purse lip breathing pattern.  - The less you use it, the better it will work when you need it. - Ok to use up to 2 puffs  every 4 hours if you must but call for  appointment if use goes up over your usual need - Don't leave home without it !!  (think of it like a spare tire or starter fluid for your car)  Also  Ok to try albuterol  15 min before an activity (on alternating days)  that you know would usually make you short of breath and see if it makes any difference and if makes none then don't take albuterol  after activity unless you can't catch your breath as this means it's the resting that helps, not the albuterol .    Stop protonix  (Prilosec) and start taking pepcid  20 mg twice daily for a week then once a day for a week then stop.  Lung cancer screening per PCP  - follow up here is as needed

## 2024-03-09 DIAGNOSIS — Z87891 Personal history of nicotine dependence: Secondary | ICD-10-CM | POA: Insufficient documentation

## 2024-03-09 NOTE — Assessment & Plan Note (Addendum)
 Stopped smoking 08/2023/MM   in setting Flu A with pna - 11/05/2023   Walked on RA   x  3  lap(s) =  approx 450  ft  @ brisk pace, stopped due to end of study  with lowest 02 sats 96% and no sob   - Allergy screen 11/05/2023 >  Eos 0.1 /  IgE  114  Alpha one MM  level 148 -PFT's  03/08/2024   FEV1 2.59 (80 % ) ratio 0.65  p 11  % improvement from saba p nothing  prior to study with DLCO  20.7 (82%)   and FV curve min concave    So he is GOLD 1  /Group A COPD and not likely to progress to more severe stages of copd unless he resumes smoking and can just use saba prn  for now -see AVS re approp saba use

## 2024-03-09 NOTE — Assessment & Plan Note (Addendum)
 Quit 08/2023 so eligible for LDSCT thru 20240   Pt wishes to defer to PCP re scheduling LDSCT as part of annual health eval   Pulmonary clinic f/u is prn          Each maintenance medication was reviewed in detail including emphasizing most importantly the difference between maintenance and prns and under what circumstances the prns are to be triggered using an action plan format where appropriate.  Total time for H and P, chart review, counseling, reviewing hfa  device(s) and generating customized AVS unique to this office visit / same day charting = 25 min

## 2024-03-15 ENCOUNTER — Ambulatory Visit: Payer: Self-pay | Admitting: Internal Medicine

## 2024-03-21 ENCOUNTER — Encounter: Payer: Self-pay | Admitting: Internal Medicine

## 2024-04-22 ENCOUNTER — Other Ambulatory Visit: Payer: Self-pay | Admitting: Internal Medicine

## 2024-04-22 DIAGNOSIS — R0609 Other forms of dyspnea: Secondary | ICD-10-CM

## 2024-04-26 ENCOUNTER — Other Ambulatory Visit: Payer: Self-pay | Admitting: Internal Medicine

## 2024-04-26 DIAGNOSIS — R0609 Other forms of dyspnea: Secondary | ICD-10-CM

## 2024-04-27 ENCOUNTER — Telehealth: Payer: Self-pay

## 2024-04-27 NOTE — Telephone Encounter (Signed)
 Copied from CRM #[id number]. Topic: Clinical - Prescription Issue >> Apr 27, 2024  1:13 PM Whitney O wrote: Reason for CRM: patient is calling because he is under the  care of dr wert and he had prescribed me 2 medications one of them came with one refil and the other one came with 10 but the one that only had one refil the pharmacy have attempted twice no response back . Patient is needing to get this medication filled .  pantoprazole  (PROTONIX ) 40 MG tablet   I checked the chart and it shows it was refused for refill not appropriate  . Please reach out to patient concerning the issue with getting this medication filled .  Called cal but no response  [phone]  LOV notes states Stop protonix  (Prilosec) and start taking pepcid  20 mg twice daily for a week then once a day for a week then stop. Atc x1 lmom

## 2024-04-28 ENCOUNTER — Encounter: Payer: Self-pay | Admitting: Internal Medicine

## 2024-04-28 ENCOUNTER — Telehealth: Payer: Self-pay

## 2024-04-28 NOTE — Telephone Encounter (Signed)
 Pt is requesting we put him back on protonix  he states his symptoms have started back up and this is what you tols him last, but originally you took him off. Would you like me to send this back in ?   If symptoms have recurred, resume whatever you were taking before you stopped in the same doses you was on and you should let your primary care dr and you GI doc know for record purposes.

## 2024-04-29 ENCOUNTER — Other Ambulatory Visit: Payer: Self-pay

## 2024-04-29 MED ORDER — PANTOPRAZOLE SODIUM 40 MG PO TBEC: 40.0000 mg | DELAYED_RELEASE_TABLET | Freq: Every day | ORAL | 3 refills | Status: AC

## 2024-04-29 NOTE — Telephone Encounter (Signed)
 Pt aware of dr leisa recs. Sent in meds to pharm

## 2024-06-08 ENCOUNTER — Encounter (INDEPENDENT_AMBULATORY_CARE_PROVIDER_SITE_OTHER): Payer: Self-pay | Admitting: *Deleted

## 2024-06-16 ENCOUNTER — Other Ambulatory Visit (HOSPITAL_COMMUNITY): Payer: Self-pay | Admitting: Family Medicine

## 2024-06-16 DIAGNOSIS — N644 Mastodynia: Secondary | ICD-10-CM

## 2024-06-26 ENCOUNTER — Other Ambulatory Visit: Payer: Self-pay | Admitting: Internal Medicine

## 2024-07-05 ENCOUNTER — Telehealth: Payer: Self-pay | Admitting: *Deleted

## 2024-07-05 NOTE — Telephone Encounter (Signed)
 Who is your primary care physician: Dr. Marvine  Reasons for the colonoscopy: screening  Have you had a colonoscopy before?  2015 Dr. Harvey  Do you have family history of colon cancer? no  Previous colonoscopy with polyps removed? yes  Do you have a history colorectal cancer?   no  Are you diabetic? If yes, Type 1 or Type 2?    no  Do you have a prosthetic or mechanical heart valve? no  Do you have a pacemaker/defibrillator?   no  Have you had endocarditis/atrial fibrillation? no  Have you had joint replacement within the last 12 months?  no  Do you tend to be constipated or have to use laxatives? no  Do you have any history of drugs or alchohol?  Yes alcohol   Do you use supplemental oxygen?  no  Have you had a stroke or heart attack within the last 6 months? no  Do you take weight loss medication?  no  Do you take any blood-thinning medications such as: (aspirin, warfarin, Plavix, Aggrenox)  no  If yes we need the name, milligram, dosage and who is prescribing doctor   Current Outpatient Medications  Medication Sig Dispense Refill   acetaminophen  (TYLENOL ) 500 MG tablet Take 1,000 mg by mouth as needed.      clonazePAM  (KLONOPIN ) 2 MG tablet Take 2 mg by mouth daily.     ezetimibe -simvastatin  (VYTORIN ) 10-40 MG tablet Take 1 tablet by mouth daily.     famotidine  (PEPCID ) 20 MG tablet Take 20 mg by mouth daily.     ibuprofen (ADVIL) 200 MG tablet Take 400 mg by mouth every 6 (six) hours as needed for moderate pain (pain score 4-6).     olmesartan (BENICAR) 40 MG tablet Take 40 mg by mouth daily.     pantoprazole  (PROTONIX ) 40 MG tablet Take 1 tablet (40 mg total) by mouth daily. 30 tablet 3   No current facility-administered medications for this visit.    No Known Allergies

## 2024-07-05 NOTE — Telephone Encounter (Signed)
Ok to schedule.  Room :Any   Thanks,  Vista Lawman, MD Gastroenterology and Hepatology [facility]

## 2024-07-12 NOTE — Telephone Encounter (Signed)
 LMOVM to call back

## 2024-07-15 NOTE — Telephone Encounter (Signed)
 Pt returned call and stated that Feb would be better to schedule procedure due to work schedule. Advised pt will call back to schedule once we get providers schedule

## 2024-07-19 ENCOUNTER — Ambulatory Visit (HOSPITAL_COMMUNITY)
Admission: RE | Admit: 2024-07-19 | Discharge: 2024-07-19 | Disposition: A | Source: Ambulatory Visit | Attending: Family Medicine | Admitting: Family Medicine

## 2024-07-19 ENCOUNTER — Ambulatory Visit (HOSPITAL_COMMUNITY)
Admission: RE | Admit: 2024-07-19 | Discharge: 2024-07-19 | Disposition: A | Discharge status: Home / Self Care | Source: Ambulatory Visit | Attending: Family Medicine | Admitting: Family Medicine

## 2024-07-19 DIAGNOSIS — N644 Mastodynia: Secondary | ICD-10-CM | POA: Insufficient documentation

## 2024-07-27 ENCOUNTER — Encounter: Payer: Self-pay | Admitting: *Deleted

## 2024-07-27 ENCOUNTER — Telehealth: Payer: Self-pay | Admitting: *Deleted

## 2024-07-27 MED ORDER — PEG 3350-KCL-NA BICARB-NACL 420 G PO SOLR: 4000.0000 mL | Freq: Once | ORAL | 0 refills | Status: AC

## 2024-07-27 NOTE — Telephone Encounter (Signed)
 Pt has been scheduled for 08/29/24. Instructions sent via mychart and prep sent to pharmacy.

## 2024-07-27 NOTE — Telephone Encounter (Signed)
 LMTRC at home #, called cell# vm is full, unable to leave message

## 2024-07-28 ENCOUNTER — Encounter (INDEPENDENT_AMBULATORY_CARE_PROVIDER_SITE_OTHER): Payer: Self-pay | Admitting: *Deleted

## 2024-07-28 NOTE — Telephone Encounter (Signed)
 Referral completed, TCS apt letter sent to PCP

## 2024-08-16 NOTE — Telephone Encounter (Signed)
 Pt called to cancel his procedure on 08/29/24 . He states he will not be able to make it at that time and is in no position to reschedule at this time. Message sent to endo to cancel procedure.

## 2024-08-25 ENCOUNTER — Encounter (HOSPITAL_COMMUNITY)

## 2024-08-29 ENCOUNTER — Encounter (HOSPITAL_COMMUNITY): Payer: Self-pay

## 2024-08-29 ENCOUNTER — Ambulatory Visit (HOSPITAL_COMMUNITY): Admit: 2024-08-29 | Admitting: Gastroenterology
